# Patient Record
Sex: Male | Born: 1970 | Race: White | Hispanic: No | Marital: Married | Smoking: Never smoker
Health system: Southern US, Community
[De-identification: ages and names within clinical notes are randomized; demographics above are authoritative.]

## PROBLEM LIST (undated history)

## (undated) DIAGNOSIS — I10 Essential (primary) hypertension: Secondary | ICD-10-CM

## (undated) DIAGNOSIS — K219 Gastro-esophageal reflux disease without esophagitis: Secondary | ICD-10-CM

## (undated) DIAGNOSIS — G4730 Sleep apnea, unspecified: Secondary | ICD-10-CM

## (undated) DIAGNOSIS — K4090 Unilateral inguinal hernia, without obstruction or gangrene, not specified as recurrent: Secondary | ICD-10-CM

## (undated) HISTORY — PX: CHOLECYSTECTOMY: SHX55

## (undated) HISTORY — DX: Sleep apnea, unspecified: G47.30

## (undated) HISTORY — PX: ANKLE FRACTURE SURGERY: SHX122

## (undated) HISTORY — DX: Gastro-esophageal reflux disease without esophagitis: K21.9

---

## 2011-11-12 ENCOUNTER — Other Ambulatory Visit (HOSPITAL_COMMUNITY): Payer: Self-pay | Admitting: Internal Medicine

## 2011-11-12 DIAGNOSIS — R1310 Dysphagia, unspecified: Secondary | ICD-10-CM

## 2011-11-12 DIAGNOSIS — K219 Gastro-esophageal reflux disease without esophagitis: Secondary | ICD-10-CM

## 2011-11-14 ENCOUNTER — Other Ambulatory Visit (HOSPITAL_COMMUNITY): Payer: Self-pay | Admitting: Internal Medicine

## 2011-11-14 ENCOUNTER — Other Ambulatory Visit (HOSPITAL_COMMUNITY): Payer: Self-pay

## 2011-11-14 DIAGNOSIS — K219 Gastro-esophageal reflux disease without esophagitis: Secondary | ICD-10-CM

## 2011-11-14 DIAGNOSIS — R1310 Dysphagia, unspecified: Secondary | ICD-10-CM

## 2011-11-21 ENCOUNTER — Ambulatory Visit (HOSPITAL_COMMUNITY)
Admission: RE | Admit: 2011-11-21 | Discharge: 2011-11-21 | Disposition: A | Discharge status: Home / Self Care | Payer: PRIVATE HEALTH INSURANCE | Source: Ambulatory Visit | Attending: Internal Medicine | Admitting: Internal Medicine

## 2011-11-21 DIAGNOSIS — R1310 Dysphagia, unspecified: Secondary | ICD-10-CM

## 2011-11-21 DIAGNOSIS — R05 Cough: Secondary | ICD-10-CM | POA: Insufficient documentation

## 2011-11-21 DIAGNOSIS — R059 Cough, unspecified: Secondary | ICD-10-CM | POA: Insufficient documentation

## 2011-11-21 DIAGNOSIS — K219 Gastro-esophageal reflux disease without esophagitis: Secondary | ICD-10-CM

## 2012-05-09 ENCOUNTER — Emergency Department (HOSPITAL_COMMUNITY): Payer: PRIVATE HEALTH INSURANCE

## 2012-05-09 ENCOUNTER — Inpatient Hospital Stay (HOSPITAL_COMMUNITY)
Admission: EM | Admit: 2012-05-09 | Discharge: 2012-05-11 | DRG: 494 | Disposition: A | Discharge status: Home Health | Payer: PRIVATE HEALTH INSURANCE | Attending: Orthopedic Surgery | Admitting: Orthopedic Surgery

## 2012-05-09 ENCOUNTER — Encounter (HOSPITAL_COMMUNITY): Payer: Self-pay | Admitting: *Deleted

## 2012-05-09 DIAGNOSIS — W010XXA Fall on same level from slipping, tripping and stumbling without subsequent striking against object, initial encounter: Secondary | ICD-10-CM | POA: Diagnosis present

## 2012-05-09 DIAGNOSIS — S82843B Displaced bimalleolar fracture of unspecified lower leg, initial encounter for open fracture type I or II: Principal | ICD-10-CM | POA: Diagnosis present

## 2012-05-09 DIAGNOSIS — S82851A Displaced trimalleolar fracture of right lower leg, initial encounter for closed fracture: Secondary | ICD-10-CM

## 2012-05-09 LAB — BASIC METABOLIC PANEL
BUN: 11 mg/dL (ref 6–23)
Calcium: 9.4 mg/dL (ref 8.4–10.5)
Chloride: 103 mEq/L (ref 96–112)
Creatinine, Ser: 0.81 mg/dL (ref 0.50–1.35)
GFR calc Af Amer: >90 mL/min (ref >90)

## 2012-05-09 LAB — CBC WITH DIFFERENTIAL/PLATELET
Basophils Absolute: 0 10*3/uL (ref 0.0–0.1)
Basophils Relative: 0 % (ref 0–1)
Eosinophils Relative: 1 % (ref 0–5)
HCT: 40.4 % (ref 39.0–52.0)
Hemoglobin: 13.4 g/dL (ref 13.0–17.0)
MCH: 28.5 pg (ref 26.0–34.0)
MCHC: 33.2 g/dL (ref 30.0–36.0)
MCV: 86 fL (ref 78.0–100.0)
Monocytes Absolute: 1 10*3/uL (ref 0.1–1.0)
Monocytes Relative: 7 % (ref 3–12)
Neutro Abs: 11 10*3/uL — ABNORMAL HIGH (ref 1.7–7.7)
RDW: 13.3 % (ref 11.5–15.5)

## 2012-05-09 MED ORDER — FENTANYL CITRATE 0.05 MG/ML IJ SOLN
100.0000 ug | Freq: Once | INTRAMUSCULAR | Status: AC
  Administered 2012-05-09: 100 ug via INTRAMUSCULAR
  Filled 2012-05-09: qty 2

## 2012-05-09 MED ORDER — HYDROMORPHONE HCL PF 1 MG/ML IJ SOLN
INTRAMUSCULAR | Status: AC
  Administered 2012-05-09: 1 mg via INTRAVENOUS
  Filled 2012-05-09: qty 1

## 2012-05-09 MED ORDER — SODIUM CHLORIDE 0.9 % IV SOLN
INTRAVENOUS | Status: DC
  Administered 2012-05-09 – 2012-05-10 (×2): via INTRAVENOUS

## 2012-05-09 MED ORDER — HYDROMORPHONE HCL PF 1 MG/ML IJ SOLN
1.0000 mg | INTRAMUSCULAR | Status: DC | PRN
  Administered 2012-05-10 (×2): 1 mg via INTRAVENOUS
  Filled 2012-05-09 (×2): qty 1

## 2012-05-09 MED ORDER — ONDANSETRON HCL 4 MG/2ML IJ SOLN
4.0000 mg | INTRAMUSCULAR | Status: AC | PRN
  Administered 2012-05-09 – 2012-05-10 (×2): 4 mg via INTRAVENOUS
  Filled 2012-05-09 (×2): qty 2

## 2012-05-09 MED ORDER — ONDANSETRON HCL 4 MG/2ML IJ SOLN
INTRAMUSCULAR | Status: AC
  Administered 2012-05-09: 22:00:00
  Filled 2012-05-09: qty 2

## 2012-05-09 NOTE — ED Notes (Addendum)
[facility] transported Mr. Meidinger to [facility] CDU-5 for observation.  Dr. Rennis Chris is coming to evaluate the patient.   According to EMS, he began to get nauseas but mainly due to the ride in the back of the ambulance.

## 2012-05-09 NOTE — ED Provider Notes (Signed)
History     CSN: [id number]  Arrival date & time 05/09/12  1906   First MD Initiated Contact with Patient 05/09/12 1916      Chief Complaint  Patient presents with  . Fall  . Ankle Pain     HPI Pt was seen at 1845.   Per pt, c/o sudden onset and persistence of constant right ankle "pain" that began after he slipped/tripped down several steps PTA.  Has been unable to weight bear on his right ankle due to pain.  Denies knee pain, no hip pain, no head injury, no neck or back pain, no focal motor weakness, no tingling/numbness in extremities, no syncope, no CP/SOB, no abd pain.   History reviewed. No pertinent past medical history.  History reviewed. No pertinent past surgical history.   History  Substance Use Topics  . Smoking status: Never Smoker   . Smokeless tobacco: Not on file  . Alcohol Use: No      Review of Systems ROS: Statement: All systems negative except as marked or noted in the HPI; Constitutional: Negative for fever and chills. ; ; Eyes: Negative for eye pain, redness and discharge. ; ; ENMT: Negative for ear pain, hoarseness, nasal congestion, sinus pressure and sore throat. ; ; Cardiovascular: Negative for chest pain, palpitations, diaphoresis, dyspnea and peripheral edema. ; ; Respiratory: Negative for cough, wheezing and stridor. ; ; Gastrointestinal: Negative for nausea, vomiting, diarrhea, abdominal pain, blood in stool, hematemesis, jaundice and rectal bleeding. . ; ; Genitourinary: Negative for dysuria, flank pain and hematuria. ; ; Musculoskeletal: +right ankle pain. Negative for back pain and neck pain. Negative for swelling and trauma.; ; Skin: Negative for pruritus, rash, abrasions, blisters, bruising and skin lesion.; ; Neuro: Negative for headache, lightheadedness and neck stiffness. Negative for weakness, altered level of consciousness , altered mental status, extremity weakness, paresthesias, involuntary movement, seizure and syncope.      Allergies   Review of patient's allergies indicates no known allergies.  Home Medications   Current Outpatient Rx  Name  Route  Sig  Dispense  Refill  . NEXIUM PO   Oral   Take 1 capsule by mouth at bedtime.         Marland Kitchen HYDROCORTISONE-ALOE VERA 1 % EX CREA   Apply externally   Apply 1 application topically daily as needed. For irritation/rash           BP 158/93  Pulse 82  Temp 97.5 F (36.4 C) (Oral)  Resp 20  Ht 6\' 3"  (1.905 m)  Wt 320 lb (145.151 kg)  BMI 40.00 kg/m2  SpO2 98%  Physical Exam 1850: Physical examination:  Nursing notes reviewed; Vital signs and O2 SAT reviewed;  Constitutional: Well developed, Well nourished, Well hydrated, Uncomfortable appearing; Head:  Normocephalic, atraumatic; Eyes: EOMI, PERRL, No scleral icterus; ENMT: Mouth and pharynx normal, Mucous membranes moist; Neck: Supple, Full range of motion, No lymphadenopathy; Cardiovascular: Regular rate and rhythm, No murmur, rub, or gallop; Respiratory: Breath sounds clear & equal bilaterally, No rales, rhonchi, wheezes.  Speaking full sentences with ease, Normal respiratory effort/excursion; Chest: Nontender, Movement normal; Abdomen: Soft, Nontender, Nondistended, Normal bowel sounds;; Extremities: Pulses normal, +right ankle localized edema and generalized tenderness to palp. No open wounds, no skin tenting, no ecchymosis, no erythema. Strong pedal pulses bilat, brisk cap refill in toes. NT right hip/knee/foot  No calf edema or asymmetry.; Neuro: AA&Ox3, Major CN grossly intact.  Speech clear. No gross focal motor or sensory deficits in extremities.; Skin:  Color normal, Warm, Dry.   ED Course  Procedures     MDM  MDM Reviewed: nursing note and vitals Interpretation: x-ray   Dg Ankle 2 Views Right 05/09/2012  *RADIOLOGY REPORT*  Clinical Data: Fall.  Ankle pain.  RIGHT ANKLE - 2 VIEW  Comparison: None.  Findings: Right ankle fracture dislocation is present.  There is an oblique distal fibular fracture.   Posterior subluxation of the talus on the tibial plafond.  Posterior malleolar fracture.  Apex medial angulation of the ankle joint.  Medial malleolar fracture is present with the tip of the medial malleolus following the talus. The oblique distal fibular fracture is displaced 5 mm, with apex medial angulation.  IMPRESSION: Trimalleolar fracture dislocation of the right ankle.   Original Report Authenticated By: Andreas Newport, M.D.     Results for orders placed during the hospital encounter of 05/09/12  CBC WITH DIFFERENTIAL      Component Value Range   WBC 13.9 (*) 4.0 - 10.5 K/uL   RBC 4.70  4.22 - 5.81 MIL/uL   Hemoglobin 13.4  13.0 - 17.0 g/dL   HCT 40.9  81.1 - 91.4 %   MCV 86.0  78.0 - 100.0 fL   MCH 28.5  26.0 - 34.0 pg   MCHC 33.2  30.0 - 36.0 g/dL   RDW 78.2  95.6 - 21.3 %   Platelets 256  150 - 400 K/uL   Neutrophils Relative 79 (*) 43 - 77 %   Neutro Abs 11.0 (*) 1.7 - 7.7 K/uL   Lymphocytes Relative 13  12 - 46 %   Lymphs Abs 1.8  0.7 - 4.0 K/uL   Monocytes Relative 7  3 - 12 %   Monocytes Absolute 1.0  0.1 - 1.0 K/uL   Eosinophils Relative 1  0 - 5 %   Eosinophils Absolute 0.1  0.0 - 0.7 K/uL   Basophils Relative 0  0 - 1 %   Basophils Absolute 0.0  0.0 - 0.1 K/uL  BASIC METABOLIC PANEL      Component Value Range   Sodium 138  135 - 145 mEq/L   Potassium 3.8  3.5 - 5.1 mEq/L   Chloride 103  96 - 112 mEq/L   CO2 26  19 - 32 mEq/L   Glucose, Bld 114 (*) 70 - 99 mg/dL   BUN 11  6 - 23 mg/dL   Creatinine, Ser 0.86  0.50 - 1.35 mg/dL   Calcium 9.4  8.4 - 57.8 mg/dL   GFR calc non Af Amer >90  >90 mL/min   GFR calc Af Amer >90  >90 mL/min     2145:  Strong right pedal pulse continues, able to move toes.  Will place post splint with stirrup splint for support.  Dx and testing d/w pt and family.  Questions answered.  Verb understanding, agreeable to transfer/admit. T/C to Ortho Dr. Rennis Chris, case discussed, including:  HPI, pertinent PM/SHx, VS/PE, dx testing, ED course  and treatment:  Agrees with placing splint (make sure it is well padded), and is agreeable to accept transfer/admit, requests to send pt to [facility] ED and have them page him when pt gets there.  [facility] Charge RN called and aware.  2245:  Pt continues to c/o pain and nausea despite multiple doses of fentanyl, dilaudid and zofran IV.  States he often becomes nauseated when he takes pain meds. Splint has been applied, right pedal pulse continues strong, continues to be able to move toes, skin  W/D/good color. Will re-dose meds, EMS here for transport.     Laray Anger, DO 05/12/12 [phone]

## 2012-05-09 NOTE — ED Notes (Signed)
Larey Seat going down step and hurt  Right ankle

## 2012-05-10 ENCOUNTER — Inpatient Hospital Stay (HOSPITAL_COMMUNITY): Payer: PRIVATE HEALTH INSURANCE

## 2012-05-10 ENCOUNTER — Inpatient Hospital Stay (HOSPITAL_COMMUNITY): Payer: PRIVATE HEALTH INSURANCE | Admitting: Anesthesiology

## 2012-05-10 ENCOUNTER — Encounter (HOSPITAL_COMMUNITY): Payer: Self-pay | Admitting: Anesthesiology

## 2012-05-10 ENCOUNTER — Encounter (HOSPITAL_COMMUNITY)
Admission: EM | Disposition: A | Discharge status: Home Health | Payer: Self-pay | Source: Home / Self Care | Attending: Orthopedic Surgery

## 2012-05-10 HISTORY — PX: ORIF ANKLE FRACTURE: SHX5408

## 2012-05-10 LAB — URINALYSIS, ROUTINE W REFLEX MICROSCOPIC
Bilirubin Urine: NEGATIVE
Ketones, ur: NEGATIVE mg/dL
Nitrite: NEGATIVE
Specific Gravity, Urine: 1.018 (ref 1.005–1.030)
Urobilinogen, UA: 1 mg/dL (ref 0.0–1.0)

## 2012-05-10 SURGERY — OPEN REDUCTION INTERNAL FIXATION (ORIF) ANKLE FRACTURE
Anesthesia: General | Site: Ankle | Laterality: Right | Wound class: Clean

## 2012-05-10 MED ORDER — ACETAMINOPHEN 10 MG/ML IV SOLN
1000.0000 mg | Freq: Four times a day (QID) | INTRAVENOUS | Status: AC
  Administered 2012-05-10 – 2012-05-11 (×4): 1000 mg via INTRAVENOUS
  Filled 2012-05-10 (×3): qty 100

## 2012-05-10 MED ORDER — HYDROCHLOROTHIAZIDE 25 MG PO TABS
25.0000 mg | ORAL_TABLET | Freq: Every day | ORAL | Status: DC
  Administered 2012-05-10 – 2012-05-11 (×2): 25 mg via ORAL
  Filled 2012-05-10 (×2): qty 1

## 2012-05-10 MED ORDER — LACTATED RINGERS IV SOLN: INTRAVENOUS | Status: DC

## 2012-05-10 MED ORDER — PROPOFOL 10 MG/ML IV BOLUS
INTRAVENOUS | Status: DC | PRN
  Administered 2012-05-10: 20 mg via INTRAVENOUS
  Administered 2012-05-10: 300 mg via INTRAVENOUS

## 2012-05-10 MED ORDER — ONDANSETRON HCL 4 MG/2ML IJ SOLN: 4.0000 mg | Freq: Once | INTRAMUSCULAR | Status: DC | PRN

## 2012-05-10 MED ORDER — DEXTROSE 5 % IV SOLN
3.0000 g | INTRAVENOUS | Status: DC | PRN
  Administered 2012-05-10: 3 g via INTRAVENOUS

## 2012-05-10 MED ORDER — PROMETHAZINE HCL 25 MG RE SUPP: 25.0000 mg | Freq: Four times a day (QID) | RECTAL | Status: DC | PRN

## 2012-05-10 MED ORDER — BACITRACIN-NEOMYCIN-POLYMYXIN 400-5-5000 EX OINT
TOPICAL_OINTMENT | CUTANEOUS | Status: DC | PRN
  Administered 2012-05-10: 1 via TOPICAL

## 2012-05-10 MED ORDER — HYDROMORPHONE HCL PF 1 MG/ML IJ SOLN
0.2500 mg | INTRAMUSCULAR | Status: DC | PRN
  Administered 2012-05-09: 1 mg via INTRAVENOUS
  Administered 2012-05-10: 0.25 mg via INTRAVENOUS
  Administered 2012-05-10: 0.5 mg via INTRAVENOUS
  Administered 2012-05-10 (×2): 0.25 mg via INTRAVENOUS

## 2012-05-10 MED ORDER — METHOCARBAMOL 100 MG/ML IJ SOLN
500.0000 mg | Freq: Four times a day (QID) | INTRAVENOUS | Status: DC | PRN
  Filled 2012-05-10: qty 5

## 2012-05-10 MED ORDER — ROCURONIUM BROMIDE 100 MG/10ML IV SOLN
INTRAVENOUS | Status: DC | PRN
  Administered 2012-05-10: 40 mg via INTRAVENOUS

## 2012-05-10 MED ORDER — LACTATED RINGERS IV SOLN
INTRAVENOUS | Status: DC | PRN
  Administered 2012-05-10: 08:00:00 via INTRAVENOUS

## 2012-05-10 MED ORDER — SODIUM CHLORIDE 0.9 % IV SOLN
INTRAVENOUS | Status: DC | PRN
  Administered 2012-05-10: 07:00:00 via INTRAVENOUS

## 2012-05-10 MED ORDER — ONDANSETRON HCL 4 MG/2ML IJ SOLN: 4.0000 mg | Freq: Four times a day (QID) | INTRAMUSCULAR | Status: DC | PRN

## 2012-05-10 MED ORDER — MIDAZOLAM HCL 5 MG/5ML IJ SOLN
INTRAMUSCULAR | Status: DC | PRN
  Administered 2012-05-10: 1 mg via INTRAVENOUS

## 2012-05-10 MED ORDER — PANTOPRAZOLE SODIUM 40 MG IV SOLR
40.0000 mg | INTRAVENOUS | Status: DC
  Administered 2012-05-10: 40 mg via INTRAVENOUS
  Filled 2012-05-10 (×2): qty 40

## 2012-05-10 MED ORDER — DEXAMETHASONE SODIUM PHOSPHATE 10 MG/ML IJ SOLN
INTRAMUSCULAR | Status: DC | PRN
  Administered 2012-05-10: 10 mg via INTRAVENOUS

## 2012-05-10 MED ORDER — ONDANSETRON HCL 4 MG/2ML IJ SOLN
4.0000 mg | Freq: Four times a day (QID) | INTRAMUSCULAR | Status: DC | PRN
  Administered 2012-05-10 (×2): 4 mg via INTRAVENOUS
  Filled 2012-05-10 (×2): qty 2

## 2012-05-10 MED ORDER — ONDANSETRON HCL 4 MG/2ML IJ SOLN
INTRAMUSCULAR | Status: DC | PRN
  Administered 2012-05-10 (×2): 4 mg via INTRAVENOUS

## 2012-05-10 MED ORDER — METHOCARBAMOL 500 MG PO TABS
500.0000 mg | ORAL_TABLET | Freq: Four times a day (QID) | ORAL | Status: DC | PRN
  Administered 2012-05-10 – 2012-05-11 (×2): 500 mg via ORAL
  Filled 2012-05-10 (×2): qty 1

## 2012-05-10 MED ORDER — DOCUSATE SODIUM 100 MG PO CAPS
100.0000 mg | ORAL_CAPSULE | Freq: Two times a day (BID) | ORAL | Status: DC
Start: 2012-05-10 — End: 2012-05-11
  Administered 2012-05-10 – 2012-05-11 (×2): 100 mg via ORAL
  Filled 2012-05-10 (×2): qty 1

## 2012-05-10 MED ORDER — METOCLOPRAMIDE HCL 10 MG PO TABS
10.0000 mg | ORAL_TABLET | Freq: Three times a day (TID) | ORAL | Status: DC
  Administered 2012-05-10 – 2012-05-11 (×2): 10 mg via ORAL
  Filled 2012-05-10 (×4): qty 1

## 2012-05-10 MED ORDER — FLEET ENEMA 7-19 GM/118ML RE ENEM: 1.0000 | ENEMA | Freq: Once | RECTAL | Status: AC | PRN

## 2012-05-10 MED ORDER — ZOLPIDEM TARTRATE 5 MG PO TABS: 5.0000 mg | ORAL_TABLET | Freq: Every evening | ORAL | Status: DC | PRN

## 2012-05-10 MED ORDER — PROMETHAZINE HCL 25 MG/ML IJ SOLN: 12.5000 mg | Freq: Four times a day (QID) | INTRAMUSCULAR | Status: DC | PRN

## 2012-05-10 MED ORDER — ENOXAPARIN SODIUM 40 MG/0.4ML ~~LOC~~ SOLN
40.0000 mg | SUBCUTANEOUS | Status: DC
  Administered 2012-05-10: 40 mg via SUBCUTANEOUS
  Filled 2012-05-10 (×2): qty 0.4

## 2012-05-10 MED ORDER — POLYETHYLENE GLYCOL 3350 17 G PO PACK: 17.0000 g | PACK | Freq: Every day | ORAL | Status: DC | PRN

## 2012-05-10 MED ORDER — SUCCINYLCHOLINE CHLORIDE 20 MG/ML IJ SOLN
INTRAMUSCULAR | Status: DC | PRN
  Administered 2012-05-10: 140 mg via INTRAVENOUS

## 2012-05-10 MED ORDER — OXYCODONE HCL 5 MG PO TABS
5.0000 mg | ORAL_TABLET | ORAL | Status: DC | PRN
  Administered 2012-05-10 – 2012-05-11 (×2): 5 mg via ORAL
  Filled 2012-05-10 (×2): qty 1

## 2012-05-10 MED ORDER — ONDANSETRON HCL 4 MG PO TABS: 4.0000 mg | ORAL_TABLET | Freq: Four times a day (QID) | ORAL | Status: DC | PRN

## 2012-05-10 MED ORDER — ARTIFICIAL TEARS OP OINT
TOPICAL_OINTMENT | OPHTHALMIC | Status: DC | PRN
  Administered 2012-05-10: 1 via OPHTHALMIC

## 2012-05-10 MED ORDER — FENTANYL CITRATE 0.05 MG/ML IJ SOLN
INTRAMUSCULAR | Status: DC | PRN
  Administered 2012-05-10: 50 ug via INTRAVENOUS
  Administered 2012-05-10: 150 ug via INTRAVENOUS

## 2012-05-10 MED ORDER — SCOPOLAMINE 1 MG/3DAYS TD PT72
MEDICATED_PATCH | TRANSDERMAL | Status: DC | PRN
  Administered 2012-05-10: 1 via TRANSDERMAL

## 2012-05-10 MED ORDER — LIDOCAINE HCL (CARDIAC) 20 MG/ML IV SOLN
INTRAVENOUS | Status: DC | PRN
  Administered 2012-05-10: 100 mg via INTRAVENOUS

## 2012-05-10 MED ORDER — BISACODYL 5 MG PO TBEC: 5.0000 mg | DELAYED_RELEASE_TABLET | Freq: Every day | ORAL | Status: DC | PRN

## 2012-05-10 MED ORDER — MORPHINE SULFATE 2 MG/ML IJ SOLN: 1.0000 mg | INTRAMUSCULAR | Status: DC | PRN

## 2012-05-10 MED ORDER — PROMETHAZINE HCL 25 MG PO TABS: 25.0000 mg | ORAL_TABLET | Freq: Four times a day (QID) | ORAL | Status: DC | PRN

## 2012-05-10 MED ORDER — METOCLOPRAMIDE HCL 5 MG/ML IJ SOLN
5.0000 mg | Freq: Three times a day (TID) | INTRAMUSCULAR | Status: DC
  Filled 2012-05-10 (×2): qty 2

## 2012-05-10 MED ORDER — DIPHENHYDRAMINE HCL 12.5 MG/5ML PO ELIX: 12.5000 mg | ORAL_SOLUTION | ORAL | Status: DC | PRN

## 2012-05-10 SURGICAL SUPPLY — 77 items
BANDAGE ELASTIC 4 VELCRO ST LF (GAUZE/BANDAGES/DRESSINGS) ×2 IMPLANT
BANDAGE ELASTIC 6 VELCRO ST LF (GAUZE/BANDAGES/DRESSINGS) ×2 IMPLANT
BANDAGE ESMARK 6X9 LF (GAUZE/BANDAGES/DRESSINGS) ×1 IMPLANT
BIT DRILL TWIST 2.7 (BIT) ×2 IMPLANT
BLADE SURG 10 STRL SS (BLADE) ×2 IMPLANT
BLADE SURG ROTATE 9660 (MISCELLANEOUS) IMPLANT
BNDG ESMARK 6X9 LF (GAUZE/BANDAGES/DRESSINGS) ×2
CLOTH BEACON ORANGE TIMEOUT ST (SAFETY) ×2 IMPLANT
CLSR STERI-STRIP ANTIMIC 1/2X4 (GAUZE/BANDAGES/DRESSINGS) ×2 IMPLANT
COVER MAYO STAND STRL (DRAPES) IMPLANT
COVER SURGICAL LIGHT HANDLE (MISCELLANEOUS) ×4 IMPLANT
CUFF TOURNIQUET SINGLE 18IN (TOURNIQUET CUFF) ×2 IMPLANT
CUFF TOURNIQUET SINGLE 24IN (TOURNIQUET CUFF) IMPLANT
CUFF TOURNIQUET SINGLE 34IN LL (TOURNIQUET CUFF) IMPLANT
CUFF TOURNIQUET SINGLE 44IN (TOURNIQUET CUFF) IMPLANT
DRAPE C-ARM 42X72 X-RAY (DRAPES) ×2 IMPLANT
DRAPE INCISE IOBAN 66X45 STRL (DRAPES) ×2 IMPLANT
DRAPE LAPAROTOMY T 102X78X121 (DRAPES) IMPLANT
DRAPE OEC MINIVIEW 54X84 (DRAPES) IMPLANT
DRAPE PROXIMA HALF (DRAPES) IMPLANT
DRAPE SURG 17X23 STRL (DRAPES) ×2 IMPLANT
DRAPE U-SHAPE 47X51 STRL (DRAPES) ×2 IMPLANT
DRSG ADAPTIC 3X8 NADH LF (GAUZE/BANDAGES/DRESSINGS) ×2 IMPLANT
DRSG PAD ABDOMINAL 8X10 ST (GAUZE/BANDAGES/DRESSINGS) ×2 IMPLANT
DURAPREP 26ML APPLICATOR (WOUND CARE) ×2 IMPLANT
ELECT REM PT RETURN 9FT ADLT (ELECTROSURGICAL) ×2
ELECTRODE REM PT RTRN 9FT ADLT (ELECTROSURGICAL) ×1 IMPLANT
FACESHIELD LNG OPTICON STERILE (SAFETY) IMPLANT
GLOVE BIO SURGEON STRL SZ7.5 (GLOVE) ×2 IMPLANT
GLOVE BIO SURGEON STRL SZ8 (GLOVE) ×2 IMPLANT
GLOVE EUDERMIC 7 POWDERFREE (GLOVE) ×2 IMPLANT
GLOVE SS BIOGEL STRL SZ 7.5 (GLOVE) ×1 IMPLANT
GLOVE SUPERSENSE BIOGEL SZ 7.5 (GLOVE) ×1
GOWN STRL NON-REIN LRG LVL3 (GOWN DISPOSABLE) ×4 IMPLANT
GOWN STRL REIN XL XLG (GOWN DISPOSABLE) ×4 IMPLANT
KIT BASIN OR (CUSTOM PROCEDURE TRAY) ×2 IMPLANT
KIT ROOM TURNOVER OR (KITS) ×2 IMPLANT
MANIFOLD NEPTUNE II (INSTRUMENTS) IMPLANT
NEEDLE 22X1 1/2 (OR ONLY) (NEEDLE) IMPLANT
NS IRRIG 1000ML POUR BTL (IV SOLUTION) ×2 IMPLANT
PACK ORTHO EXTREMITY (CUSTOM PROCEDURE TRAY) ×2 IMPLANT
PAD ARMBOARD 7.5X6 YLW CONV (MISCELLANEOUS) ×4 IMPLANT
PAD CAST 4YDX4 CTTN HI CHSV (CAST SUPPLIES) ×2 IMPLANT
PADDING CAST ABS 6INX4YD NS (CAST SUPPLIES) ×1
PADDING CAST ABS COTTON 6X4 NS (CAST SUPPLIES) ×1 IMPLANT
PADDING CAST COTTON 4X4 STRL (CAST SUPPLIES) ×2
PLATE LCP 3.5 1/3 TUB 10HX117 (Plate) ×2 IMPLANT
SCREW CANN 24MM (Screw) IMPLANT
SCREW CANN S THRD/44 4.0 (Screw) ×2 IMPLANT
SCREW CORTEX 3.5 16MM (Screw) ×2 IMPLANT
SCREW CORTEX 3.5 18MM (Screw) ×1 IMPLANT
SCREW CORTEX 3.5 20MM (Screw) ×2 IMPLANT
SCREW CORTEX 3.5 24MM (Screw) ×1 IMPLANT
SCREW LOCK CORT ST 3.5X16 (Screw) ×2 IMPLANT
SCREW LOCK CORT ST 3.5X18 (Screw) ×1 IMPLANT
SCREW LOCK CORT ST 3.5X20 (Screw) ×2 IMPLANT
SCREW LOCK CORT ST 3.5X24 (Screw) ×1 IMPLANT
SCREW LOCK T15 FT 10X3.5X2.9X (Screw) ×1 IMPLANT
SCREW LOCKING 3.5X10 (Screw) ×1 IMPLANT
SCREW SHORT THREAD 4.0X46 (Screw) ×2 IMPLANT
SPLINT FIBERGLASS 4X15 (CAST SUPPLIES) ×2 IMPLANT
SPONGE GAUZE 4X4 12PLY (GAUZE/BANDAGES/DRESSINGS) ×2 IMPLANT
SPONGE LAP 4X18 X RAY DECT (DISPOSABLE) ×2 IMPLANT
STAPLER VISISTAT 35W (STAPLE) IMPLANT
STRIP CLOSURE SKIN 1/2X4 (GAUZE/BANDAGES/DRESSINGS) ×4 IMPLANT
SUCTION FRAZIER TIP 10 FR DISP (SUCTIONS) ×2 IMPLANT
SUT MNCRL AB 3-0 PS2 18 (SUTURE) IMPLANT
SUT VIC AB 0 CT1 27 (SUTURE)
SUT VIC AB 0 CT1 27XBRD ANBCTR (SUTURE) IMPLANT
SUT VIC AB 2-0 CT1 27 (SUTURE)
SUT VIC AB 2-0 CT1 TAPERPNT 27 (SUTURE) IMPLANT
SYR CONTROL 10ML LL (SYRINGE) IMPLANT
TOWEL OR 17X24 6PK STRL BLUE (TOWEL DISPOSABLE) ×2 IMPLANT
TOWEL OR 17X26 10 PK STRL BLUE (TOWEL DISPOSABLE) ×2 IMPLANT
TUBE CONNECTING 12X1/4 (SUCTIONS) ×2 IMPLANT
WASHER 7MM DIA (Washer) ×4 IMPLANT
WATER STERILE IRR 1000ML POUR (IV SOLUTION) IMPLANT

## 2012-05-10 NOTE — Op Note (Signed)
Edwin Rogers, Edwin Rogers        ACCOUNT NO.:  [id number]  MEDICAL RECORD NO.:  [id number]  LOCATION:  [facility]                         FACILITY:  [facility]  PHYSICIAN:  Vania Rea. Tawney Vanorman, M.D.  DATE OF BIRTH:  1971/02/23  DATE OF PROCEDURE: DATE OF DISCHARGE:                              OPERATIVE REPORT   ADDENDUM:  Ralene Bathe, PA-C was used as an Geophysicist/field seismologist throughout this case essential for help with positioning extremity and assistance to consider the patient's morbid obesity as well as retraction, manipulation of the fracture site, assistance with application of the hardware, wound closure, and intraoperative decision making.     Vania Rea. Cosimo Schertzer, M.D.     KMS/MEDQ  D:  05/10/2012  T:  05/10/2012  Job:  [id number]

## 2012-05-10 NOTE — ED Notes (Signed)
Pt vomiting.  Dr. Rennis Chris paged for medication order.

## 2012-05-10 NOTE — Anesthesia Postprocedure Evaluation (Signed)
  Anesthesia Post-op Note  Patient: Newman Waren  Procedure(s) Performed: Procedure(s) (LRB) with comments: OPEN REDUCTION INTERNAL FIXATION (ORIF) ANKLE FRACTURE (Right)  Patient Location: PACU  Anesthesia Type:General  Level of Consciousness: awake, alert  and patient cooperative  Airway and Oxygen Therapy: Patient Spontanous Breathing  Post-op Pain: mild  Post-op Assessment: Post-op Vital signs reviewed, Patient's Cardiovascular Status Stable, Respiratory Function Stable, Patent Airway, No signs of Nausea or vomiting and Pain level controlled  Post-op Vital Signs: stable  Complications: No apparent anesthesia complications

## 2012-05-10 NOTE — Op Note (Signed)
05/09/2012 - 05/10/2012  9:42 AM  PATIENT:   Edwin Rogers  41 y.o. male  PRE-OPERATIVE DIAGNOSIS:  fractured right ankle  POST-OPERATIVE DIAGNOSIS:  Right bimalleolar ankle fracture  PROCEDURE:  ORIF  SURGEON:  Medrith Veillon, Vania Rea M.D.  ASSISTANTS: Shuford pac   ANESTHESIA:   GET  EBL: min  SPECIMEN:  none  Drains: none  TT <27min   PATIENT DISPOSITION:  PACU - hemodynamically stable.    PLAN OF CARE: Admit to inpatient   Dictation#   [id number]

## 2012-05-10 NOTE — ED Notes (Signed)
Patient is resting comfortably. 

## 2012-05-10 NOTE — Transfer of Care (Signed)
Immediate Anesthesia Transfer of Care Note  Patient: Edwin Rogers  Procedure(s) Performed: Procedure(s) (LRB) with comments: OPEN REDUCTION INTERNAL FIXATION (ORIF) ANKLE FRACTURE (Right)  Patient Location: PACU  Anesthesia Type:General  Level of Consciousness: awake, alert  and oriented  Airway & Oxygen Therapy: Patient Spontanous Breathing and Patient connected to nasal cannula oxygen  Post-op Assessment: Report given to PACU RN, Post -op Vital signs reviewed and stable and Patient moving all extremities  Post vital signs: Reviewed and stable  Complications: No apparent anesthesia complications

## 2012-05-10 NOTE — Preoperative (Addendum)
Beta Blockers   Reason not to administer Beta Blockers:Not Applicable 

## 2012-05-10 NOTE — Op Note (Signed)
Edwin Rogers, Edwin Rogers        ACCOUNT NO.:  [id number]  MEDICAL RECORD NO.:  [id number]  LOCATION:  [facility]                         FACILITY:  [facility]  PHYSICIAN:  Vania Rea. Jaceion Aday, M.D.  DATE OF BIRTH:  27-Jan-1971  DATE OF PROCEDURE:  05/10/2012 DATE OF DISCHARGE:                              OPERATIVE REPORT   PREOPERATIVE DIAGNOSIS:  Displaced right bimalleolar ankle fracture dislocation.  POSTOPERATIVE DIAGNOSIS:  Displaced right bimalleolar ankle fracture dislocation.  PROCEDURE:  Open reduction and internal fixation of right bimalleolar ankle fracture.  SURGEON:  Vania Rea. Koleman Marling, MD  ASSISTANT:  Lucita Lora. Shuford, PA-C  ANESTHESIA:  General endotracheal.  TOURNIQUET TIME:  Less than 90 minutes.  ESTIMATED BLOOD LOSS:  Minimal.  DRAINS:  None.  HISTORY:  Mr. Sedor is a 41 year old gentleman, who tripped at home down several stairs, injuring his right ankle with immediate pain, deformity, and inability to bear weight.  He was taken to the [facility] where on evaluation he was found to have deformity of the right foot and ankle with x-rays obtained showing a right ankle fracture dislocation.  Orthopedic care was not available up in [place] and he was subsequently transferred to [facility] secondary to inability to mobilize and care for self.  I was consulted last night where on evaluation Mr. Narine noted to be a very large heavyset gentleman in obvious discomfort, complaining of nausea.  His right lower extremity had been splinted but he was grossly intact to light touch with good digital motion.  X-rays reviewed confirming fracture dislocation, although this had been apparently reduced when the splint was placed. He was subsequently admitted and brought to the operating room this morning for planned ORIF.  Preoperatively, I counseled Mr. Tippett and his family on treatment options as well as risks versus benefits thereof.  Possible  surgical complications were reviewed including potential bleeding, infection, neurovascular injury, DVT, PE, malunion, nonunion, loss of fixation, posttraumatic arthritis, anesthetic complication, possible need for additional surgery.  He understands and accepts and agrees with our planned procedure.  PROCEDURE IN DETAIL:  After undergoing routine preop evaluation, the patient did receive prophylactic antibiotics, brought into the operating room and placed supine on the operating table, underwent smooth induction of general endotracheal anesthesia.  Tourniquet applied to the right thigh.  Splint was removed.  Skin was carefully inspected and found to be intact with no obvious blistering.  The right lower extremity was then sterilely prepped and draped in standard fashion. Time-out was called.  The leg was exsanguinated with the tourniquet inflated to 350 mmHg.  We began with an anteromedial hockey stick incision about the malleolus with skin flaps elevated.  Dissection carried deep into the fracture site with care taken to protect the surrounding neurovascular structures.  Interposed soft tissue and periosteum was removed from the fracture site as was clot.  This was copiously irrigated.  Talar dome appeared to be intact.  Once we confirmed the reduction could be obtained, we then turned our attention laterally where a 10 cm longitudinal incision was made over the distal fibula.  Skin flaps elevated.  The peroneal muscles and tendons were reflected posteriorly and subperiosteal dissection was used to expose the fracture site and  the posterolateral cortex of the distal fibula. The fracture site was exposed.  The interposed soft tissue and clot was removed, irrigated and then under direct visualization, the fracture site was reduced.  I then contoured a 10 hole 1/3rd tubular locking plate to fit over the posterolateral cortex of the fibula.  Once this was applied with a bone holding  forceps, the overall construct was visualized radiographically showing good alignment.  We then repaired the fracture with 2 lag screws through the plate across the fracture site obtaining excellent fixation.  I then placed 2 distal locking screws into the tip of malleolus.  The balance of screws were filled with 3.5 cortical screws, the most proximal screw with a locking screw unicortical.  All screws had excellent bony purchase and fluoroscopic images showed good alignment and good position of the hardware.  We then once again turned our attention medially and under direct visualization the medial malleolus was reduced and the threaded guidewires for the 4.0 cannulated screws were then passed across the fracture site.  We confirmed proper position fluoroscopically.  This was filled with 46 mm screws with washers and again bony purchase was achieved with good fixation to our satisfaction.  The final fluoroscopic images were all showing good alignment, good position of the hardware.  All wounds were then copiously irrigated.  Closed with 2-0 Vicryl for the subcu layer and intracuticular 3-0 Monocryl for the skin, 0 Vicryl for the deep fascia, 2-0 Vicryl for subcu, and Steri-Strips for the skin laterally. Steri-Strips, Adaptic, and dry dressing placed over the incisions. Balance leg was coated with triple antibiotic ointment and a well-padded short-leg stirrup splint was applied with ankle in neutral position.  At this point, the tourniquet was then let down and the patient was awakened, extubated, and taken to the recovery room in stable condition.     Vania Rea. Itamar Mcgowan, M.D.     KMS/MEDQ  D:  05/10/2012  T:  05/10/2012  Job:  [id number]

## 2012-05-10 NOTE — Anesthesia Preprocedure Evaluation (Addendum)
Anesthesia Evaluation  Patient identified by MRN, date of birth, ID band Patient awake    Reviewed: Allergy & Precautions, NPO status   Airway Mallampati: IV TM Distance: >3 FB Neck ROM: Full    Dental  (+) Teeth Intact and Dental Advisory Given   Pulmonary          Cardiovascular Rhythm:regular Rate:Normal     Neuro/Psych    GI/Hepatic   Endo/Other    Renal/GU      Musculoskeletal   Abdominal   Peds  Hematology   Anesthesia Other Findings   Reproductive/Obstetrics                        Anesthesia Physical Anesthesia Plan  ASA: I  Anesthesia Plan: General   Post-op Pain Management:    Induction: Intravenous  Airway Management Planned: Oral ETT  Additional Equipment:   Intra-op Plan:   Post-operative Plan: Extubation in OR  Informed Consent: I have reviewed the patients History and Physical, chart, labs and discussed the procedure including the risks, benefits and alternatives for the proposed anesthesia with the patient or authorized representative who has indicated his/her understanding and acceptance.     Plan Discussed with: CRNA, Anesthesiologist and Surgeon  Anesthesia Plan Comments:         Anesthesia Quick Evaluation

## 2012-05-10 NOTE — Anesthesia Procedure Notes (Signed)
Procedure Name: Intubation Date/Time: 05/10/2012 7:53 AM Performed by: Julianne Rice K Pre-anesthesia Checklist: Emergency Drugs available, Patient identified, Timeout performed, Suction available and Patient being monitored Patient Re-evaluated:Patient Re-evaluated prior to inductionOxygen Delivery Method: Circle system utilized Preoxygenation: Pre-oxygenation with 100% oxygen Intubation Type: IV induction, Rapid sequence and Cricoid Pressure applied Ventilation: Mask ventilation without difficulty Laryngoscope Size: Mac and 4 Grade View: Grade III Tube type: Oral Tube size: 8.0 mm Number of attempts: 1 Airway Equipment and Method: Stylet and LTA kit utilized Placement Confirmation: positive ETCO2,  ETT inserted through vocal cords under direct vision and breath sounds checked- equal and bilateral Secured at: 25 cm Tube secured with: Tape Dental Injury: Teeth and Oropharynx as per pre-operative assessment

## 2012-05-10 NOTE — ED Notes (Signed)
Received call from [facility] in OR.  States pt to come to pre-op holding at 0630.  Anesthesia will be there to see pt.  Pt and his spouse informed of plan.

## 2012-05-10 NOTE — H&P (Signed)
Edwin Rogers    Chief Complaint: right ankle pain HPI: The patient is a 41 y.o. male fell at home this evening with immediate c/o right ankle pain and inability to bear weight. Seen in [facility] ED, splinted at transferred to [place].  PMH reflux  History reviewed. No pertinent past surgical history.  History reviewed. No pertinent family history.  Social History:  reports that he has never smoked. He does not have any smokeless tobacco history on file. He reports that he does not drink alcohol or use illicit drugs.  Allergies: No Known Allergies  Med: nexium   Physical Exam: heavy set white male, a and o, nauseated/vomited. RLE with short leg splint, moves toes well, intact to light touch  Xray with bimalleolar fracture dislocation  Vitals  Temp:  [97.5 F (36.4 C)-98.6 F (37 C)] 98.1 F (36.7 C) (12/20 2352) Pulse Rate:  [73-85] 73  (12/20 2352) Resp:  [18-20] 18  (12/20 2352) BP: (137-158)/(85-102) 137/102 mmHg (12/20 2352) SpO2:  [95 %-100 %] 95 % (12/20 2352) Weight:  [145.151 kg (320 lb)] 145.151 kg (320 lb) (12/20 1908)  Assessment/Plan  Impression: right ankle fracture dislocation  Plan of Action: admit due to patients inability to care for self and ambulate NWB. Treatment options, risks, benefits reviewed. Surgery in am  Martyna Thorns M 05/10/2012, 12:08 AM

## 2012-05-11 MED ORDER — METHOCARBAMOL 500 MG PO TABS: 500.0000 mg | ORAL_TABLET | Freq: Four times a day (QID) | ORAL | Status: DC | PRN

## 2012-05-11 MED ORDER — DSS 100 MG PO CAPS: 100.0000 mg | ORAL_CAPSULE | Freq: Two times a day (BID) | ORAL | Status: DC

## 2012-05-11 MED ORDER — ASCRIPTIN 325 MG PO TABS: 325.0000 mg | ORAL_TABLET | Freq: Two times a day (BID) | ORAL | Status: AC

## 2012-05-11 MED ORDER — HYDROCHLOROTHIAZIDE 25 MG PO TABS: 25.0000 mg | ORAL_TABLET | Freq: Every day | ORAL | Status: DC

## 2012-05-11 MED ORDER — PROMETHAZINE HCL 12.5 MG PO TABS: 12.5000 mg | ORAL_TABLET | Freq: Four times a day (QID) | ORAL | Status: DC | PRN

## 2012-05-11 MED ORDER — BISACODYL 5 MG PO TBEC: 5.0000 mg | DELAYED_RELEASE_TABLET | Freq: Every day | ORAL | Status: DC | PRN

## 2012-05-11 MED ORDER — OXYCODONE HCL 5 MG PO TABS
5.0000 mg | ORAL_TABLET | ORAL | Status: DC | PRN
Start: 2012-05-11 — End: 2013-04-13

## 2012-05-11 NOTE — Progress Notes (Signed)
Patient ID: Edwin Rogers, male   DOB: 09/30/1970, 41 y.o.   MRN: [id number] Subjective: 1 Day Post-Op Procedure(s) (LRB): OPEN REDUCTION INTERNAL FIXATION (ORIF) ANKLE FRACTURE (Right)    Patient reports pain as moderate but tolerable on pain meds  Objective:   VITALS:   Filed Vitals:   05/11/12 0930  BP: 144/82  Pulse:   Temp:   Resp:     Sensation intact distally Post op splint intact, dry  LABS  Basename 05/09/12 2055  HGB 13.4  HCT 40.4  WBC 13.9*  PLT 256     Basename 05/09/12 2055  NA 138  K 3.8  BUN 11  CREATININE 0.81  GLUCOSE 114*    No results found for this basename: LABPT:2,INR:2 in the last 72 hours   Assessment/Plan: 1 Day Post-Op Procedure(s) (LRB): OPEN REDUCTION INTERNAL FIXATION (ORIF) ANKLE FRACTURE (Right)   Discharge home with home health NWB RLE Keep splint dry Follow up 2 weeks

## 2012-05-11 NOTE — Progress Notes (Signed)
Occupational Therapy Evaluation Patient Details Name: Edwin Rogers MRN: [id number] DOB: 1970-06-21 Today's Date: 05/11/2012 Time: 1478-2956 OT Time Calculation (min): 20 min  OT Assessment / Plan / Recommendation Clinical Impression  41 yo s/p fall with R ankle fx. ORIF. NWB. Completed all education regarding ADL compensatory techniques and AE/DME needs, in addition to home safety. Pt will have adequate S for safe D/C home. no further OT needed.    OT Assessment  Patient does not need any further OT services    Follow Up Recommendations  No OT follow up    Barriers to Discharge  none    Equipment Recommendations  None recommended by OT    Recommendations for Other Services  none  Frequency    eval only   Precautions / Restrictions Precautions Precautions: None Restrictions Weight Bearing Restrictions: Yes RLE Weight Bearing: Non weight bearing   Pertinent Vitals/Pain 3. No request for pain meds    ADL  Grooming: Supervision/safety Where Assessed - Grooming: Unsupported standing Upper Body Bathing: Set up Where Assessed - Upper Body Bathing: Unsupported sitting Lower Body Bathing: Supervision/safety Where Assessed - Lower Body Bathing: Unsupported sit to stand Upper Body Dressing: Set up Where Assessed - Upper Body Dressing: Unsupported sitting Lower Body Dressing: Minimal assistance Where Assessed - Lower Body Dressing: Unsupported sit to stand Toilet Transfer: Supervision/safety Toilet Transfer Method: Sit to stand;Stand pivot Acupuncturist: Bedside commode Toileting - Clothing Manipulation and Hygiene: Supervision/safety Where Assessed - Engineer, mining and Hygiene: Standing Tub/Shower Transfer: Therapist, sports Method: Passenger transport manager: Other (comment) (3 in 1) Equipment Used: Gait belt;Rolling walker Transfers/Ambulation Related to ADLs: Mod I by end of session ADL Comments:  Completed education regarding compensatory techniques for ADL and available AE completed.discussed need for DME. Pt needs RW, but is going to borrow 3 in 1    OT Diagnosis:    OT Problem List:   OT Treatment Interventions:     OT Goals Acute Rehab OT Goals OT Goal Formulation:  (eval only)  Visit Information  Last OT Received On: 05/11/12 Assistance Needed: +1    Subjective Data      Prior Functioning     Home Living Lives With: Spouse Available Help at Discharge: Family;Available 24 hours/day Type of Home: House Home Access: Stairs to enter [employer] of Steps: 2 Entrance Stairs-Rails: None Home Layout: One level Bathroom Shower/Tub: Walk-in shower;Door [employer] Toilet: Pharmacist, community: Yes How Accessible: Accessible via walker Home Adaptive Equipment: None Prior Function Level of Independence: Independent Able to Take Stairs?: Yes Driving: Yes 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: No difficulties Dominant Hand: Right         Vision/Perception     Cognition  Overall Cognitive Status: Appears within functional limits for tasks assessed/performed Arousal/Alertness: Awake/alert Orientation Level: Appears intact for tasks assessed Behavior During Session: [facility] for tasks performed    Extremity/Trunk Assessment Right Upper Extremity Assessment RUE ROM/Strength/Tone: [facility] for tasks assessed Left Upper Extremity Assessment LUE ROM/Strength/Tone: [facility] for tasks assessed Right Lower Extremity Assessment RLE ROM/Strength/Tone: Deficits;Unable to fully assess;Due to pain;Due to precautions RLE ROM/Strength/Tone Deficits: hip and knee WFL. UTA ankle RLE Sensation: WFL - Light Touch Left Lower Extremity Assessment LLE ROM/Strength/Tone: WFL for tasks assessed LLE Sensation: WFL - Light Touch Trunk Assessment Trunk Assessment: Normal     Mobility Bed Mobility Bed Mobility: Not  assessed Supine to Sit: 6: Modified independent (Device/Increase time) Sitting - Scoot to Edge of Bed: 6:  Modified independent (Device/Increase time) Transfers Transfers: Sit to Stand;Stand to Sit Sit to Stand: 5: Supervision;With upper extremity assist;From chair/3-in-1 Stand to Sit: 5: Supervision;With upper extremity assist;To chair/3-in-1;To toilet Details for Transfer Assistance: s initially. Mod i at end of session     Shoulder Instructions     Exercise     Balance Balance Balance Assessed:  (WFl)   End of Session OT - End of Session Equipment Utilized During Treatment: Gait belt Activity Tolerance: Patient tolerated treatment well Patient left: in chair;with call bell/phone within reach Nurse Communication: Mobility status;Other (comment) (ready for d/C)  GO     Edwin Rogers,HILLARY 05/11/2012, 11:44 AM Luisa Dago, OTR/L  [phone] 05/11/2012

## 2012-05-11 NOTE — Evaluation (Signed)
Physical Therapy Evaluation Patient Details Name: Edwin Rogers MRN: [id number] DOB: [birth year] Today's Date: 05/11/2012 Time: 0810-0828 PT Time Calculation (min): 18 min  PT Assessment / Plan / Recommendation Clinical Impression  Pt s/p ORIF of R ankle fx. Pt moving well, still requiring assistance for safety. Pt may discharge home today, educated on safe technique at home. Pt will benefit from skilled PT in the acute care setting in order to maximize functional mobility and safety. '    PT Assessment  Patient needs continued PT services    Follow Up Recommendations  No PT follow up;Supervision for mobility/OOB    Does the patient have the potential to tolerate intense rehabilitation      Barriers to Discharge        Equipment Recommendations  Rolling walker with 5" wheels    Recommendations for Other Services     Frequency Min 4X/week    Precautions / Restrictions Precautions Precautions: None Restrictions Weight Bearing Restrictions: Yes RLE Weight Bearing: Non weight bearing   Pertinent Vitals/Pain Pain 4/10. Rn aware and pain meds given      Mobility  Bed Mobility Bed Mobility: Supine to Sit;Sitting - Scoot to Edge of Bed Supine to Sit: 6: Modified independent (Device/Increase time) Sitting - Scoot to Edge of Bed: 6: Modified independent (Device/Increase time) Transfers Transfers: Sit to Stand;Stand to Sit Sit to Stand: 4: Min guard;With upper extremity assist;From bed;From chair/3-in-1 Stand to Sit: 4: Min guard;With upper extremity assist;To chair/3-in-1 Details for Transfer Assistance: [place] for safety. Cues for safe hand placement and technique to maintain NWB during stand Ambulation/Gait Ambulation/Gait Assistance: 4: Min guard Ambulation Distance (Feet): 40 Feet Assistive device: Rolling walker Ambulation/Gait Assistance Details: VC for proper sequencing while hopping. Cues for safety Gait Pattern: Step-to pattern Gait velocity: slow   Stairs: Yes Stairs Assistance: 4: Min assist Stairs Assistance Details (indicate cue type and reason): VC for proper sequencing with backwards with RW. Min assist for stability Stair Management Technique: No rails;Step to pattern;Backwards;With walker Number of Stairs: 2     Shoulder Instructions     Exercises     PT Diagnosis: Acute pain  PT Problem List: Decreased activity tolerance;Decreased balance;Decreased mobility;Decreased knowledge of use of DME;Decreased safety awareness;Decreased knowledge of precautions PT Treatment Interventions: DME instruction;Gait training;Stair training;Functional mobility training;Therapeutic activities;Patient/family education   PT Goals Acute Rehab PT Goals PT Goal Formulation: With patient Time For Goal Achievement: 05/18/12 Potential to Achieve Goals: Good Pt will go Sit to Stand: with modified independence PT Goal: Sit to Stand - Progress: Goal set today Pt will go Stand to Sit: with modified independence PT Goal: Stand to Sit - Progress: Goal set today Pt will Transfer Bed to Chair/Chair to Bed: with modified independence PT Transfer Goal: Bed to Chair/Chair to Bed - Progress: Goal set today Pt will Ambulate: 16 - 50 feet;with supervision;with least restrictive assistive device PT Goal: Ambulate - Progress: Goal set today Pt will Go Up / Down Stairs: 1-2 stairs;with min assist;with rolling walker PT Goal: Up/Down Stairs - Progress: Goal set today  Visit Information  Last PT Received On: 05/11/12 Assistance Needed: +1    Subjective Data  Patient Stated Goal: to go home   Prior Functioning  Home Living Lives With: Spouse Available Help at Discharge: Family;Available 24 hours/day Type of Home: House Home Access: Stairs to enter [employer] of Steps: 2 Entrance Stairs-Rails: None Home Layout: One level Bathroom Shower/Tub: Walk-in shower;Door [employer] Toilet: Standard Bathroom Accessibility: Yes How Accessible:  Accessible via  walker Home Adaptive Equipment: None Prior Function Level of Independence: Independent Able to Take Stairs?: Yes Driving: Yes 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 for tasks performed    Extremity/Trunk Assessment Right Lower Extremity Assessment RLE ROM/Strength/Tone: Deficits;Unable to fully assess;Due to pain RLE ROM/Strength/Tone Deficits: hip and knee WFL. UTA ankle RLE Sensation: WFL - Light Touch Left Lower Extremity Assessment LLE ROM/Strength/Tone: Within functional levels LLE Sensation: WFL - Light Touch   Balance    End of Session PT - End of Session Equipment Utilized During Treatment: Gait belt Activity Tolerance: Patient tolerated treatment well Patient left: in chair;with call bell/phone within reach;Other (comment) (with OT) Nurse Communication: Mobility status  GP     Milana Kidney 05/11/2012, 9:39 AM  05/11/2012 Milana Kidney DPT PAGER: [phone] OFFICE: [phone]

## 2012-05-12 ENCOUNTER — Encounter (HOSPITAL_COMMUNITY): Payer: Self-pay | Admitting: Orthopedic Surgery

## 2012-05-15 NOTE — Discharge Summary (Signed)
Physician Discharge Summary  Patient ID: Edwin Rogers MRN: [id number] DOB/AGE: 11/22/1970 41 y.o.  Admit date: 05/09/2012 Discharge date: 05/15/2012  Admission Diagnoses: Right ankle fracture  Discharge Diagnoses: same  Discharged Condition: good  Hospital Course: Pt was admitted for operative care of his right ankle fracture and underwent ORIF on 05/10/12 and tolerated this well. All appropriate antibiotics were utilized. He was kept overnight for observationa nd pain control and on the following am was doing well. Pain was controlled and PT saw and evaluated patient. He was felt to be stable for DC home  Consults: None   Operative Procedure: Procedure(s): OPEN REDUCTION INTERNAL FIXATION (ORIF) ANKLE FRACTUREON12/20/2013 - 05/10/2012  Disposition: 06-Home-Health Care Svc     Medication List     As of 05/15/2012 11:07 AM    TAKE these medications         ASCRIPTIN 325 MG Tabs   Take 325 mg by mouth 2 (two) times daily.      bisacodyl 5 MG EC tablet   Commonly known as: DULCOLAX   Take 1 tablet (5 mg total) by mouth daily as needed.      CORTIZONE-10 INTENSIVE HEALING 1 % Crea   Generic drug: Hydrocortisone-Aloe Vera   Apply 1 application topically daily as needed. For irritation/rash      DSS 100 MG Caps   Take 100 mg by mouth 2 (two) times daily.      hydrochlorothiazide 25 MG tablet   Commonly known as: HYDRODIURIL   Take 1 tablet (25 mg total) by mouth daily.      methocarbamol 500 MG tablet   Commonly known as: ROBAXIN   Take 1 tablet (500 mg total) by mouth every 6 (six) hours as needed.      NEXIUM PO   Take 1 capsule by mouth at bedtime.      oxyCODONE 5 MG immediate release tablet   Commonly known as: Oxy IR/ROXICODONE   Take 1-2 tablets (5-10 mg total) by mouth every 3 (three) hours as needed.      promethazine 12.5 MG tablet   Commonly known as: PHENERGAN   Take 1-2 tablets (12.5-25 mg total) by mouth every 6 (six) hours as needed for  nausea.           Follow-up Information    Follow up with Senaida Lange, MD. In 2 weeks.   Contact information:   [address] [place] [address] [phone]          Discharge Instructions: NWB RLE Keep splint clean and dry and elevate right leg. Resume home meds and diet.  SignedRalene Bathe 05/15/2012, 11:07 AM

## 2013-03-06 ENCOUNTER — Encounter (INDEPENDENT_AMBULATORY_CARE_PROVIDER_SITE_OTHER): Payer: Self-pay | Admitting: *Deleted

## 2013-04-13 ENCOUNTER — Encounter (INDEPENDENT_AMBULATORY_CARE_PROVIDER_SITE_OTHER): Payer: Self-pay | Admitting: Internal Medicine

## 2013-04-13 ENCOUNTER — Telehealth (INDEPENDENT_AMBULATORY_CARE_PROVIDER_SITE_OTHER): Payer: Self-pay | Admitting: *Deleted

## 2013-04-13 ENCOUNTER — Ambulatory Visit (INDEPENDENT_AMBULATORY_CARE_PROVIDER_SITE_OTHER): Payer: PRIVATE HEALTH INSURANCE | Admitting: Internal Medicine

## 2013-04-13 ENCOUNTER — Other Ambulatory Visit (INDEPENDENT_AMBULATORY_CARE_PROVIDER_SITE_OTHER): Payer: Self-pay | Admitting: *Deleted

## 2013-04-13 VITALS — BP 112/82 | HR 72 | Temp 98.5°F | Ht 75.0 in | Wt 313.4 lb

## 2013-04-13 DIAGNOSIS — Z800 Family history of malignant neoplasm of digestive organs: Secondary | ICD-10-CM

## 2013-04-13 DIAGNOSIS — Z1211 Encounter for screening for malignant neoplasm of colon: Secondary | ICD-10-CM

## 2013-04-13 MED ORDER — PEG-KCL-NACL-NASULF-NA ASC-C 100 G PO SOLR: 1.0000 | Freq: Once | ORAL | Status: DC

## 2013-04-13 NOTE — Progress Notes (Signed)
Subjective:     Patient ID: Edwin Rogers, male   DOB: [birth year], 42 y.o.   MRN: [id number]  HPI Referred to our office by Dr. Regino Schultze for constipation/colonoscopy. He says he became constipated after dieting. He says it was so bad he had to disimpact himself. When he disimpacted himself, he saw blood. None since then. The constipation last for about a week. He started a stool softener and stools were much better. Stools are now softer. Appetite is good. He has lost weight which was intentional (25 pounds) No abdominal pain. Usually has a BM daily. Family hx of colon cancer father age 24 grandmother and grandfather, and uncle.   Review of Systems Current Outpatient Prescriptions  Medication Sig Dispense Refill  . bisacodyl (DULCOLAX) 5 MG EC tablet Take 1 tablet (5 mg total) by mouth daily as needed.  30 tablet  0  . docusate sodium 100 MG CAPS Take 100 mg by mouth 2 (two) times daily.  60 capsule  0  . Esomeprazole Magnesium (NEXIUM PO) Take 1 capsule by mouth at bedtime.       No current facility-administered medications for this visit.   Past Medical History  Diagnosis Date  . Sleep apnea   . GERD (gastroesophageal reflux disease)    Past Surgical History  Procedure Laterality Date  . Orif ankle fracture  05/10/2012    Procedure: OPEN REDUCTION INTERNAL FIXATION (ORIF) ANKLE FRACTURE;  Surgeon: Senaida Lange, MD;  Location: [facility] OR;  Service: Orthopedics;  Laterality: Right;  . Ankle fracture surgery      last year with a plate   No Known Allergies      Objective:   Physical Exam  Filed Vitals:   04/13/13 1103  BP: 112/82  Pulse: 72  Temp: 98.5 F (36.9 C)  Height: 6\' 3"  (1.905 m)  Weight: 313 lb 6.4 oz (142.157 kg)  Alert and oriented. Skin warm and dry. Oral mucosa is moist.   . Sclera anicteric, conjunctivae is pink. Thyroid not enlarged. No cervical lymphadenopathy. Lungs clear. Heart regular rate and rhythm.  Abdomen is soft. Bowel sounds are positive. No  hepatomegaly. No abdominal masses felt. No tenderness. Abdomen is obese. No edema to lower extremities.        Assessment:   Constipation resolved at this time. Using a stool softener. Probably related to dieting.   Family hx of colon cancer. Plan:    Screening colonoscopy. Family hx of colon cancer

## 2013-04-13 NOTE — Telephone Encounter (Signed)
Patient needs movi prep 

## 2013-04-13 NOTE — Patient Instructions (Signed)
Screening colonoscopy with Dr. Karilyn Cota.

## 2013-05-12 ENCOUNTER — Encounter (HOSPITAL_COMMUNITY): Payer: Self-pay | Admitting: Pharmacy Technician

## 2013-05-27 ENCOUNTER — Ambulatory Visit (HOSPITAL_COMMUNITY)
Admission: RE | Admit: 2013-05-27 | Discharge: 2013-05-27 | Disposition: A | Discharge status: Home / Self Care | Payer: PRIVATE HEALTH INSURANCE | Source: Ambulatory Visit | Attending: Internal Medicine | Admitting: Internal Medicine

## 2013-05-27 ENCOUNTER — Encounter (HOSPITAL_COMMUNITY): Payer: Self-pay | Admitting: *Deleted

## 2013-05-27 ENCOUNTER — Encounter (HOSPITAL_COMMUNITY)
Admission: RE | Disposition: A | Discharge status: Home / Self Care | Payer: Self-pay | Source: Ambulatory Visit | Attending: Internal Medicine

## 2013-05-27 DIAGNOSIS — Z800 Family history of malignant neoplasm of digestive organs: Secondary | ICD-10-CM

## 2013-05-27 DIAGNOSIS — K620 Anal polyp: Secondary | ICD-10-CM

## 2013-05-27 DIAGNOSIS — D126 Benign neoplasm of colon, unspecified: Secondary | ICD-10-CM | POA: Insufficient documentation

## 2013-05-27 DIAGNOSIS — K621 Rectal polyp: Secondary | ICD-10-CM

## 2013-05-27 DIAGNOSIS — Z1211 Encounter for screening for malignant neoplasm of colon: Secondary | ICD-10-CM

## 2013-05-27 HISTORY — PX: COLONOSCOPY: SHX5424

## 2013-05-27 SURGERY — COLONOSCOPY
Anesthesia: Moderate Sedation

## 2013-05-27 MED ORDER — MEPERIDINE HCL 50 MG/ML IJ SOLN
INTRAMUSCULAR | Status: DC | PRN
  Administered 2013-05-27 (×2): 25 mg via INTRAVENOUS

## 2013-05-27 MED ORDER — MIDAZOLAM HCL 5 MG/5ML IJ SOLN
INTRAMUSCULAR | Status: AC
  Filled 2013-05-27: qty 10

## 2013-05-27 MED ORDER — MIDAZOLAM HCL 5 MG/5ML IJ SOLN
INTRAMUSCULAR | Status: DC | PRN
  Administered 2013-05-27 (×5): 2 mg via INTRAVENOUS

## 2013-05-27 MED ORDER — MEPERIDINE HCL 50 MG/ML IJ SOLN
INTRAMUSCULAR | Status: AC
  Filled 2013-05-27: qty 1

## 2013-05-27 MED ORDER — SODIUM CHLORIDE 0.9 % IV SOLN
INTRAVENOUS | Status: DC
  Administered 2013-05-27: 12:00:00 via INTRAVENOUS

## 2013-05-27 MED ORDER — STERILE WATER FOR IRRIGATION IR SOLN
Status: DC | PRN
  Administered 2013-05-27: 13:00:00

## 2013-05-27 NOTE — Op Note (Signed)
COLONOSCOPY PROCEDURE REPORT  PATIENT:  Edwin Rogers  MR#:  [id number] Birthdate:  1970-07-11, 43 y.o., male Endoscopist:  Dr. Rogene Houston, MD Referred By:  Dr. Sherrilee Gilles. Gerarda Fraction, MD Procedure Date: 05/27/2013  Procedure:   Colonoscopy  Indications:  Patient is 43 year old Caucasian male who is undergoing screening colonoscopy. Family history is significant for colon carcinoma in first and second-degree relatives.  Informed Consent:  The procedure and risks were reviewed with the patient and informed consent was obtained.  Medications:  Demerol 50 mg IV Versed 10 mg IV  Description of procedure:  After a digital rectal exam was performed, that colonoscope was advanced from the anus through the rectum and colon to the area of the cecum, ileocecal valve and appendiceal orifice. The cecum was deeply intubated. These structures were well-seen and photographed for the record. From the level of the cecum and ileocecal valve, the scope was slowly and cautiously withdrawn. The mucosal surfaces were carefully surveyed utilizing scope tip to flexion to facilitate fold flattening as needed. The scope was pulled down into the rectum where a thorough exam including retroflexion was performed.  Findings:   Prep excellent. Three small polyps were removed and submitted together(polyp from the splenic flexure and sigmoid colon removed via cold biopsy and third polyp from distal sigmoid colon was cold snared). Four small polyps removed from rectum using cold snare and biopsy. These polyps were submitted together. Small hemorrhoids below the dentate line.   Therapeutic/Diagnostic Maneuvers Performed:  See above  Complications:  None  Cecal Withdrawal Time:  16  minutes  Impression:  Examination performed to cecum. Three small polyps removed using cold snared and biopsy. These polyps are submitted together(one at splenic flexure and two at sigmoid colon). Four small polyps from rectum were  submitted together; two were removed with biopsy forceps and two were cold snared.  Recommendations:  Standard instructions given. I will contact patient with biopsy results and further recommendations.  Noel Rodier U  05/27/2013 2:03 PM  CC: Dr. Glo Herring., MD & Dr. Rayne Du ref. provider found

## 2013-05-27 NOTE — H&P (Signed)
Edwin Rogers is an 42 y.o. male.   Chief Complaint: Patient is here for colonoscopy. HPI: Patient is 42-year-old Caucasian male who is here for high-risk screening colonoscopy. He has history of constipation but this has resolved. He had an episode of hematochezia 2 months ago felt to be secondary hemorrhoids. Family history significant for a colon carcinoma in his father who had right hemicolectomy at age 66 and diet he had later secondary to C. Difficile. Family history is also significant for colon carcinoma in maternal grandmother as well as great uncle.  Past Medical History  Diagnosis Date  . Sleep apnea   . GERD (gastroesophageal reflux disease)     Past Surgical History  Procedure Laterality Date  . Orif ankle fracture  05/10/2012    Procedure: OPEN REDUCTION INTERNAL FIXATION (ORIF) ANKLE FRACTURE;  Surgeon: Kevin M Supple, MD;  Location: [facility] OR;  Service: Orthopedics;  Laterality: Right;  . Ankle fracture surgery      last year with a plate    Family History  Problem Relation Age of Onset  . Colon cancer Father   . Colon cancer Other   . Colon cancer Other    Social History:  reports that he has never smoked. He does not have any smokeless tobacco history on file. He reports that he does not drink alcohol or use illicit drugs.  Allergies: No Known Allergies  Medications Prior to Admission  Medication Sig Dispense Refill  . Esomeprazole Magnesium (NEXIUM PO) Take 1 capsule by mouth at bedtime.      . famotidine-calcium carbonate-magnesium hydroxide (PEPCID COMPLETE) 10-800-165 MG CHEW chewable tablet Chew 1 tablet by mouth daily as needed. Heartburn      . peg 3350 powder (MOVIPREP) 100 G SOLR Take 1 kit (200 g total) by mouth once.  1 kit  0    No results found for this or any previous visit (from the past 48 hour(s)). No results found.  ROS  Blood pressure 144/84, pulse 67, temperature 98.2 F (36.8 C), temperature source Oral, resp. rate 16, height 6' 3"  (1.905 m), weight 313 lb (141.976 kg), SpO2 97.00%. Physical Exam  Constitutional: He appears well-developed and well-nourished.  HENT:  Mouth/Throat: Oropharynx is clear and moist.  Eyes: Conjunctivae are normal. No scleral icterus.  Neck: No thyromegaly present.  Cardiovascular: Normal rate, regular rhythm and normal heart sounds.   No murmur heard. Respiratory: Effort normal and breath sounds normal.  GI: Soft. He exhibits no distension and no mass. There is no tenderness.  Musculoskeletal: He exhibits no edema.  Lymphadenopathy:    He has no cervical adenopathy.  Neurological: He is alert.  Skin: Skin is warm and dry.     Assessment/Plan High-risk screening colonoscopy. Family history of colon carcinoma in multiple family numbers including father.   REHMAN,NAJEEB U 05/27/2013, 1:20 PM    

## 2013-05-27 NOTE — Discharge Instructions (Signed)
Resume usual medications and diet. No driving for 24 hours. Physician will contact you with biopsy results.  Colonoscopy Care After Read the instructions outlined below and refer to this sheet in the next few weeks. These discharge instructions provide you with general information on caring for yourself after you leave the hospital. Your doctor may also give you specific instructions. While your treatment has been planned according to the most current medical practices available, unavoidable complications occasionally occur. If you have any problems or questions after discharge, call your doctor. HOME CARE INSTRUCTIONS ACTIVITY:  You may resume your regular activity, but move at a slower pace for the next 24 hours.  Take frequent rest periods for the next 24 hours.  Walking will help get rid of the air and reduce the bloated feeling in your belly (abdomen).  No driving for 24 hours (because of the medicine (anesthesia) used during the test).  You may shower.  Do not sign any important legal documents or operate any machinery for 24 hours (because of the anesthesia used during the test). NUTRITION:  Drink plenty of fluids.  You may resume your normal diet as instructed by your doctor.  Begin with a light meal and progress to your normal diet. Heavy or fried foods are harder to digest and may make you feel sick to your stomach (nauseated).  Avoid alcoholic beverages for 24 hours or as instructed. MEDICATIONS:  You may resume your normal medications unless your doctor tells you otherwise. WHAT TO EXPECT TODAY:  Some feelings of bloating in the abdomen.  Passage of more gas than usual.  Spotting of blood in your stool or on the toilet paper. IF YOU HAD POLYPS REMOVED DURING THE COLONOSCOPY:  No aspirin products for 7 days or as instructed.  No alcohol for 7 days or as instructed.  Eat a soft diet for the next 24 hours. FINDING OUT THE RESULTS OF YOUR TEST Not all test  results are available during your visit. If your test results are not back during the visit, make an appointment with your caregiver to find out the results. Do not assume everything is normal if you have not heard from your caregiver or the medical facility. It is important for you to follow up on all of your test results.  SEEK IMMEDIATE MEDICAL CARE IF:  You have more than a spotting of blood in your stool.  Your belly is swollen (abdominal distention).  You are nauseated or vomiting.  You have a fever.  You have abdominal pain or discomfort that is severe or gets worse throughout the day. Document Released: 12/20/2003 Document Revised: 07/30/2011 Document Reviewed: 12/18/2007 [facility] Patient Information 2014 [place]. Colon Polyps Polyps are lumps of extra tissue growing inside the body. Polyps can grow in the large intestine (colon). Most colon polyps are noncancerous (benign). However, some colon polyps can become cancerous over time. Polyps that are larger than a pea may be harmful. To be safe, caregivers remove and test all polyps. CAUSES  Polyps form when mutations in the genes cause your cells to grow and divide even though no more tissue is needed. RISK FACTORS There are a number of risk factors that can increase your chances of getting colon polyps. They include:  Being older than 50 years.  Family history of colon polyps or colon cancer.  Long-term colon diseases, such as colitis or Crohn disease.  Being overweight.  Smoking.  Being inactive.  Drinking too much alcohol. SYMPTOMS  Most small polyps  do not cause symptoms. If symptoms are present, they may include:  Blood in the stool. The stool may look dark red or black.  Constipation or diarrhea that lasts longer than 1 week. DIAGNOSIS People often do not know they have polyps until their caregiver finds them during a regular checkup. Your caregiver can use 4 tests to check for polyps:  Digital rectal  exam. The caregiver wears gloves and feels inside the rectum. This test would find polyps only in the rectum.  Barium enema. The caregiver puts a liquid called barium into your rectum before taking X-rays of your colon. Barium makes your colon look white. Polyps are dark, so they are easy to see in the X-ray pictures.  Sigmoidoscopy. A thin, flexible tube (sigmoidoscope) is placed into your rectum. The sigmoidoscope has a light and tiny camera in it. The caregiver uses the sigmoidoscope to look at the last third of your colon.  Colonoscopy. This test is like sigmoidoscopy, but the caregiver looks at the entire colon. This is the most common method for finding and removing polyps. TREATMENT  Any polyps will be removed during a sigmoidoscopy or colonoscopy. The polyps are then tested for cancer. PREVENTION  To help lower your risk of getting more colon polyps:  Eat plenty of fruits and vegetables. Avoid eating fatty foods.  Do not smoke.  Avoid drinking alcohol.  Exercise every day.  Lose weight if recommended by your caregiver.  Eat plenty of calcium and folate. Foods that are rich in calcium include milk, cheese, and broccoli. Foods that are rich in folate include chickpeas, kidney beans, and spinach. HOME CARE INSTRUCTIONS Keep all follow-up appointments as directed by your caregiver. You may need periodic exams to check for polyps. SEEK MEDICAL CARE IF: You notice bleeding during a bowel movement. Document Released: 02/01/2004 Document Revised: 07/30/2011 Document Reviewed: 07/17/2011 [facility] Patient Information 2014 [place].

## 2013-06-01 ENCOUNTER — Encounter (HOSPITAL_COMMUNITY): Payer: Self-pay | Admitting: Internal Medicine

## 2013-06-10 ENCOUNTER — Encounter (INDEPENDENT_AMBULATORY_CARE_PROVIDER_SITE_OTHER): Payer: Self-pay | Admitting: *Deleted

## 2014-11-22 DIAGNOSIS — K219 Gastro-esophageal reflux disease without esophagitis: Secondary | ICD-10-CM | POA: Insufficient documentation

## 2014-11-22 DIAGNOSIS — I10 Essential (primary) hypertension: Secondary | ICD-10-CM | POA: Insufficient documentation

## 2014-11-22 DIAGNOSIS — E782 Mixed hyperlipidemia: Secondary | ICD-10-CM | POA: Insufficient documentation

## 2015-01-05 ENCOUNTER — Ambulatory Visit (HOSPITAL_COMMUNITY)
Admission: RE | Admit: 2015-01-05 | Discharge: 2015-01-05 | Disposition: A | Discharge status: Home / Self Care | Payer: PRIVATE HEALTH INSURANCE | Source: Ambulatory Visit | Attending: Internal Medicine | Admitting: Internal Medicine

## 2015-01-05 ENCOUNTER — Other Ambulatory Visit (HOSPITAL_COMMUNITY): Payer: Self-pay | Admitting: Internal Medicine

## 2015-01-05 DIAGNOSIS — M546 Pain in thoracic spine: Secondary | ICD-10-CM

## 2015-05-24 ENCOUNTER — Other Ambulatory Visit (HOSPITAL_COMMUNITY): Payer: Self-pay | Admitting: Internal Medicine

## 2015-05-24 DIAGNOSIS — R109 Unspecified abdominal pain: Secondary | ICD-10-CM

## 2015-05-27 ENCOUNTER — Encounter (HOSPITAL_COMMUNITY): Payer: Self-pay

## 2015-05-27 ENCOUNTER — Encounter (HOSPITAL_COMMUNITY)
Admission: RE | Admit: 2015-05-27 | Discharge: 2015-05-27 | Disposition: A | Discharge status: Home / Self Care | Payer: PRIVATE HEALTH INSURANCE | Source: Ambulatory Visit | Attending: Internal Medicine | Admitting: Internal Medicine

## 2015-05-27 DIAGNOSIS — R109 Unspecified abdominal pain: Secondary | ICD-10-CM | POA: Diagnosis not present

## 2015-05-27 DIAGNOSIS — N281 Cyst of kidney, acquired: Secondary | ICD-10-CM | POA: Diagnosis present

## 2015-05-27 MED ORDER — SINCALIDE 5 MCG IJ SOLR
INTRAMUSCULAR | Status: AC
  Administered 2015-05-27: 2.95 ug via INTRAVENOUS
  Filled 2015-05-27: qty 5

## 2015-05-27 MED ORDER — STERILE WATER FOR INJECTION IJ SOLN
INTRAMUSCULAR | Status: AC
  Administered 2015-05-27: 2.95 mL via INTRAVENOUS
  Filled 2015-05-27: qty 10

## 2015-05-27 MED ORDER — TECHNETIUM TC 99M MEBROFENIN IV KIT
5.0000 | PACK | Freq: Once | INTRAVENOUS | Status: AC | PRN
  Administered 2015-05-27: 5.3 via INTRAVENOUS

## 2015-06-02 ENCOUNTER — Encounter (INDEPENDENT_AMBULATORY_CARE_PROVIDER_SITE_OTHER): Payer: Self-pay | Admitting: *Deleted

## 2015-06-29 ENCOUNTER — Encounter (INDEPENDENT_AMBULATORY_CARE_PROVIDER_SITE_OTHER): Payer: Self-pay | Admitting: Internal Medicine

## 2015-06-29 ENCOUNTER — Ambulatory Visit (INDEPENDENT_AMBULATORY_CARE_PROVIDER_SITE_OTHER): Payer: PRIVATE HEALTH INSURANCE | Admitting: Internal Medicine

## 2015-06-29 VITALS — BP 144/100 | HR 80 | Temp 98.1°F | Ht 75.0 in | Wt 339.1 lb

## 2015-06-29 DIAGNOSIS — N2889 Other specified disorders of kidney and ureter: Secondary | ICD-10-CM | POA: Diagnosis not present

## 2015-06-29 DIAGNOSIS — R110 Nausea: Secondary | ICD-10-CM | POA: Diagnosis not present

## 2015-06-29 DIAGNOSIS — R1011 Right upper quadrant pain: Secondary | ICD-10-CM | POA: Diagnosis not present

## 2015-06-29 NOTE — Progress Notes (Signed)
Subjective:    Patient ID: Edwin Rogers, male    DOB: [birth year], 45 y.o.   MRN: [id number]  HPI Referred by Dr. Gerarda Fraction for pain in his rt upper quadrant and back x 1 year. About 3 months ago he had violent diarrhea and vomiting for a day. His wife became sick also. There was no fever associated with the symptoms. About 2 weeks later he had same symptoms. Since then he has had a small amt of diarrhea. He says his stool has changed. He says he stools are greasy like. His stools have been solid.  He still has some soreness on his rt upper abdomen. If he lies down and gets up he will have back pain. He also has some lower rt abdominal pain.  [place] foods, garlic bother him.  The other night he ate at the [employer] and he had nausea and rt upper quadrant pain. He has acid reflux daily if he doesn't use the C-pap and take the Pepcid.  05/27/2015 HIDA scan: rt upper quadrant pain with nausea and vomitig.  IMPRESSION: There is no evidence of cystic duct or common bile duct obstruction. The gallbladder ejection fraction is near the lower limit of normal at 45%.   04/26/2015 US abdomen:  Complex cyst left upper pole renal mass. Recommend renal protocol CT scan for further evaluation. Fatty infiltration of the liver. \ 05/27/2013 Colonoscopy  Indications: Patient is 45 year old Caucasian male who is undergoing screening colonoscopy. Family history is significant for colon carcinoma in first and second-degree relatives.  Impression:  Examination performed to cecum. Three small polyps removed using cold snared and biopsy. These polyps are submitted together(one at splenic flexure and two at sigmoid colon). Four small polyps from rectum were submitted together; two were removed with biopsy forceps and two were cold snared.  Small rectal polyps are hyperplastic. Off the 3 other small polyps one is sessile serrated adenoma and others hyperplastic. Patient called and message/resolved left on  his answering service. Next colonoscopy in 5 years Report to PCP  Review of Systems Past Medical History  Diagnosis Date  . Sleep apnea   . GERD (gastroesophageal reflux disease)     Past Surgical History  Procedure Laterality Date  . Orif ankle fracture  05/10/2012    Procedure: OPEN REDUCTION INTERNAL FIXATION (ORIF) ANKLE FRACTURE;  Surgeon: Marin Shutter, MD;  Location: [place];  Service: Orthopedics;  Laterality: Right;  . Ankle fracture surgery      last year with a plate  . Colonoscopy N/A 05/27/2013    Procedure: COLONOSCOPY;  Surgeon: Rogene Houston, MD;  [facility];  Service: Endoscopy;  Laterality: N/A;  100    No Known Allergies  Current Outpatient Prescriptions on File Prior to Visit  Medication Sig Dispense Refill  . famotidine-calcium carbonate-magnesium hydroxide (PEPCID COMPLETE) [phone] MG CHEW chewable tablet Chew 1 tablet by mouth daily. Heartburn     No current facility-administered medications on file prior to visit.        Objective:   Physical Exam Blood pressure 144/100, pulse 80, temperature 98.1 F (36.7 C), height 6\' 3"  (1.905 m), weight 339 lb 1.6 oz (153.815 kg).  Alert and oriented. Skin warm and dry. Oral mucosa is moist.   . Sclera anicteric, conjunctivae is pink. Thyroid not enlarged. No cervical lymphadenopathy. Lungs clear. Heart regular rate and rhythm.  Abdomen is soft. Bowel sounds are positive. No hepatomegaly. No abdominal masses felt. No tenderness.  No edema to lower  extremities.         Assessment & Plan:  Rt upper quadrant pain.  ? GB disease. Am going to try to rule out.  CT abdomen/pelvis with CM. Also patient has a renal mass.  Hepatic function. OV in  6 weeks.

## 2015-06-29 NOTE — Patient Instructions (Signed)
Hepatic function

## 2015-06-30 LAB — HEPATIC FUNCTION PANEL
ALBUMIN: 3.8 g/dL (ref 3.6–5.1)
ALT: 30 U/L (ref 9–46)
AST: 21 U/L (ref 10–40)
Alkaline Phosphatase: 98 U/L (ref 40–115)
BILIRUBIN TOTAL: 0.3 mg/dL (ref 0.2–1.2)
Bilirubin, Direct: 0.1 mg/dL (ref <=0.2)
Indirect Bilirubin: 0.2 mg/dL (ref 0.2–1.2)
TOTAL PROTEIN: 6.8 g/dL (ref 6.1–8.1)

## 2015-07-06 ENCOUNTER — Ambulatory Visit (HOSPITAL_COMMUNITY)
Admission: RE | Admit: 2015-07-06 | Discharge: 2015-07-06 | Disposition: A | Discharge status: Home / Self Care | Payer: PRIVATE HEALTH INSURANCE | Source: Ambulatory Visit | Attending: Internal Medicine | Admitting: Internal Medicine

## 2015-07-06 DIAGNOSIS — R911 Solitary pulmonary nodule: Secondary | ICD-10-CM | POA: Diagnosis not present

## 2015-07-06 DIAGNOSIS — D1779 Benign lipomatous neoplasm of other sites: Secondary | ICD-10-CM | POA: Insufficient documentation

## 2015-07-06 DIAGNOSIS — N2889 Other specified disorders of kidney and ureter: Secondary | ICD-10-CM | POA: Diagnosis not present

## 2015-07-06 DIAGNOSIS — R1011 Right upper quadrant pain: Secondary | ICD-10-CM

## 2015-07-06 DIAGNOSIS — R112 Nausea with vomiting, unspecified: Secondary | ICD-10-CM | POA: Insufficient documentation

## 2015-07-06 DIAGNOSIS — K4090 Unilateral inguinal hernia, without obstruction or gangrene, not specified as recurrent: Secondary | ICD-10-CM | POA: Insufficient documentation

## 2015-07-06 DIAGNOSIS — R110 Nausea: Secondary | ICD-10-CM

## 2015-07-06 MED ORDER — IOHEXOL 300 MG/ML  SOLN
100.0000 mL | Freq: Once | INTRAMUSCULAR | Status: AC | PRN
  Administered 2015-07-06: 100 mL via INTRAVENOUS

## 2015-07-07 ENCOUNTER — Encounter (INDEPENDENT_AMBULATORY_CARE_PROVIDER_SITE_OTHER): Payer: Self-pay

## 2015-07-19 ENCOUNTER — Other Ambulatory Visit: Payer: Self-pay | Admitting: Urology

## 2015-07-19 ENCOUNTER — Ambulatory Visit (INDEPENDENT_AMBULATORY_CARE_PROVIDER_SITE_OTHER): Payer: PRIVATE HEALTH INSURANCE | Admitting: Urology

## 2015-07-19 DIAGNOSIS — C642 Malignant neoplasm of left kidney, except renal pelvis: Secondary | ICD-10-CM

## 2015-07-19 DIAGNOSIS — D1772 Benign lipomatous neoplasm of other genitourinary organ: Secondary | ICD-10-CM

## 2015-07-27 ENCOUNTER — Other Ambulatory Visit (HOSPITAL_COMMUNITY): Payer: PRIVATE HEALTH INSURANCE

## 2015-08-01 ENCOUNTER — Ambulatory Visit (HOSPITAL_COMMUNITY)
Admission: RE | Admit: 2015-08-01 | Discharge: 2015-08-01 | Disposition: A | Discharge status: Home / Self Care | Payer: PRIVATE HEALTH INSURANCE | Source: Ambulatory Visit | Attending: Urology | Admitting: Urology

## 2015-08-01 DIAGNOSIS — C642 Malignant neoplasm of left kidney, except renal pelvis: Secondary | ICD-10-CM | POA: Insufficient documentation

## 2015-08-01 DIAGNOSIS — D1779 Benign lipomatous neoplasm of other sites: Secondary | ICD-10-CM | POA: Diagnosis not present

## 2015-08-01 DIAGNOSIS — K760 Fatty (change of) liver, not elsewhere classified: Secondary | ICD-10-CM | POA: Diagnosis not present

## 2015-08-01 MED ORDER — GADOBENATE DIMEGLUMINE 529 MG/ML IV SOLN
20.0000 mL | Freq: Once | INTRAVENOUS | Status: AC | PRN
  Administered 2015-08-01: 20 mL via INTRAVENOUS

## 2015-08-08 ENCOUNTER — Other Ambulatory Visit: Payer: Self-pay | Admitting: Surgery

## 2015-08-08 NOTE — H&P (Signed)
Edwin Rogers 08/08/2015 2:25 PM Location: [facility] Patient #: [id number] DOB: December 31, 1970 Married / Language: Cleophus Molt / Race: White Male  History of Present Illness [name] MD; 08/08/2015 3:15 PM) Patient words: gallbladder.  The patient is a 45 year old male who presents for evaluation of gallbladder disease. Note for "Gallbladder disease": Patient sent for surgical consultation by Dr. Franchot Gallo with [facility]. Abdominal pain. Probable biliary dyskinesia. Plan partial nephrectomy. Consider combination case  Pleasant active male. Comes today with his wife. Has had intermittent episodes of abdominal pain. Primarily RIGHT back and RIGHT upper quadrant. About 9 months ago. Usually mild. Seem to be after [place] food such as spaghetti and pizza. Then had a more intense episode of swelling and discomfort. Had a bad attack of nausea vomiting and diarrhea in November. Wife felt a little sick as well. Wondered if it was a stomach bug. That happened to 2 weeks later. Addition. Dr. Riley Kill ordered an ultrasound. No gallstones but kidney mass noted. Can an MRI converted a LEFT upper pole kidney mass at least 4 cm suspicious. Saw urology. Felt benefit from nephrectomy. Probable partial nephrectomy. Therefore referring to Dr. Phebe Colla for robotic partial resection.  In the meantime, he is still getting abdominal pains and attacks. That showed gallbladder ejection fraction was borderline normal. The production of symptoms. However patient is having trouble with attacks. He does have heartburn controlled with Pepcid at night. This does not seem like that. It is not improved by taking extra doses. He can walk a half hour without difficulty. Stays physically active. His a large overweight man but no exertional pain. Interestingly, his father had gallbladder problems or prior cholecystectomy and then developed kidney mass as well. His father was  diagnosed with colon cancer. The patient did have a colonoscopy in 2015. Many polyps removed. All but one were hyperplastic. He saw urology. They wondered if he would benefit from cholecystectomy at the time of the nephrectomy. Therefore sent for surgical consultation with [place] since we do robotic surgery.  No personal nor family history of GI/colon cancer, inflammatory bowel disease, irritable bowel syndrome, allergy such as Celiac Sprue, dietary/dairy problems, colitis, ulcers nor gastritis. No recent sick contacts/gastroenteritis. No travel outside the country. No changes in diet. No dysphagia to solids or liquids. No significant heartburn or reflux. No hematochezia, hematemesis, coffee ground emesis. No evidence of prior gastric/peptic ulceration.   Other Problems Marjean Donna, CMA; 08/08/2015 2:25 PM) Cancer Gastroesophageal Reflux Disease Sleep Apnea  Past Surgical History Marjean Donna, CMA; 08/08/2015 2:25 PM) Colon Polyp Removal - Colonoscopy Foot Surgery Right.  Diagnostic Studies History Marjean Donna, CMA; 08/08/2015 2:25 PM) Colonoscopy 1-5 years ago  Allergies Davy Pique Bynum, CMA; 08/08/2015 2:26 PM) No Known Drug Allergies 08/08/2015  Medication History (Sonya Bynum, CMA; 08/08/2015 2:26 PM) No Current Medications Medications Reconciled  Social History Marjean Donna, CMA; 08/08/2015 2:25 PM) Caffeine use Carbonated beverages, Coffee, Tea. No alcohol use No drug use Tobacco use Never smoker.  Family History Marjean Donna, [place]; 08/08/2015 2:25 PM) Cancer Father. Colon Cancer Father. Colon Polyps Father. Diabetes Mellitus Father. Hypertension Mother. Melanoma Father. Thyroid problems Brother.     Review of Systems Davy Pique Bynum CMA; 08/08/2015 2:25 PM) General Not Present- Appetite Loss, Chills, Fatigue, Fever, Night Sweats, Weight Gain and Weight Loss. Skin Present- Dryness. Not Present- Change in Wart/Mole, Hives, Jaundice, New Lesions,  Non-Healing Wounds, Rash and Ulcer. HEENT Not Present- Earache, Hearing Loss, Hoarseness, Nose Bleed, Oral Ulcers, Ringing in the Ears,  Seasonal Allergies, Sinus Pain, Sore Throat, Visual Disturbances, Wears glasses/contact lenses and Yellow Eyes. Respiratory Present- Snoring. Not Present- Bloody sputum, Chronic Cough, Difficulty Breathing and Wheezing. Breast Not Present- Breast Mass, Breast Pain, Nipple Discharge and Skin Changes. Cardiovascular Not Present- Chest Pain, Difficulty Breathing Lying Down, Leg Cramps, Palpitations, Rapid Heart Rate, Shortness of Breath and Swelling of Extremities. Gastrointestinal Present- Abdominal Pain, Bloating, Change in Bowel Habits, Hemorrhoids and Indigestion. Not Present- Bloody Stool, Chronic diarrhea, Constipation, Difficulty Swallowing, Excessive gas, Gets full quickly at meals, Nausea, Rectal Pain and Vomiting. Male Genitourinary Not Present- Blood in Urine, Change in Urinary Stream, Frequency, Impotence, Nocturia, Painful Urination, Urgency and Urine Leakage. Musculoskeletal Present- Joint Stiffness. Not Present- Back Pain, Joint Pain, Muscle Pain, Muscle Weakness and Swelling of Extremities. Neurological Not Present- Decreased Memory, Fainting, Headaches, Numbness, Seizures, Tingling, Tremor, Trouble walking and Weakness. Psychiatric Not Present- Anxiety, Bipolar, Change in Sleep Pattern, Depression, Fearful and Frequent crying. Endocrine Not Present- Cold Intolerance, Excessive Hunger, Hair Changes, Heat Intolerance, Hot flashes and New Diabetes. Hematology Not Present- Easy Bruising, Excessive bleeding, Gland problems, HIV and Persistent Infections.  Vitals (Sonya Bynum CMA; 08/08/2015 2:25 PM) 08/08/2015 2:25 PM Weight: 339 lb Height: 75in Body Surface Area: 2.75 m Body Mass Index: 42.37 kg/m  Temp.: 82F(Temporal)  Pulse: 80 (Regular)  BP: 132/80 (Sitting, Left Arm, Standard)      Physical Exam [name] MD; 08/08/2015 3:08  PM)  General Mental Status-Alert. General Appearance-Not in acute distress, Not Sickly. Orientation-Oriented X3. Hydration-Well hydrated. Voice-Normal.  Integumentary Global Assessment Upon inspection and palpation of skin surfaces of the - Axillae: non-tender, no inflammation or ulceration, no drainage. and Distribution of scalp and body hair is normal. General Characteristics Temperature - normal warmth is noted.  Head and Neck Head-normocephalic, atraumatic with no lesions or palpable masses. Face Global Assessment - atraumatic, no absence of expression. Neck Global Assessment - no abnormal movements, no bruit auscultated on the right, no bruit auscultated on the left, no decreased range of motion, non-tender. Trachea-midline. Thyroid Gland Characteristics - non-tender.  Eye Eyeball - Left-Extraocular movements intact, No Nystagmus. Eyeball - Right-Extraocular movements intact, No Nystagmus. Cornea - Left-No Hazy. Cornea - Right-No Hazy. Sclera/Conjunctiva - Left-No scleral icterus, No Discharge. Sclera/Conjunctiva - Right-No scleral icterus, No Discharge. Pupil - Left-Direct reaction to light normal. Pupil - Right-Direct reaction to light normal.  ENMT Ears Pinna - Left - no drainage observed, no generalized tenderness observed. Right - no drainage observed, no generalized tenderness observed. Nose and Sinuses External Inspection of the Nose - no destructive lesion observed. Inspection of the nares - Left - quiet respiration. Right - quiet respiration. Mouth and Throat Lips - Upper Lip - no fissures observed, no pallor noted. Lower Lip - no fissures observed, no pallor noted. Nasopharynx - no discharge present. Oral Cavity/Oropharynx - Tongue - no dryness observed. Oral Mucosa - no cyanosis observed. Hypopharynx - no evidence of airway distress observed.  Chest and Lung Exam Inspection Movements - Normal and Symmetrical. Accessory muscles  - No use of accessory muscles in breathing. Palpation Palpation of the chest reveals - Non-tender. Auscultation Breath sounds - Normal and Clear.  Cardiovascular Auscultation Rhythm - Regular. Murmurs & Other Heart Sounds - Auscultation of the heart reveals - No Murmurs and No Systolic Clicks.  Abdomen Inspection Inspection of the abdomen reveals - No Visible peristalsis and No Abnormal pulsations. Umbilicus - No Bleeding, No Urine drainage. Palpation/Percussion Palpation and Percussion of the abdomen reveal - Soft, Non Tender, No  Rebound tenderness, No Rigidity (guarding) and No Cutaneous hyperesthesia. Note: Obese but soft. Mild diastases recti. No umbilical hernia.  RIGHT upper quadrant discomfort but no true Murphy sign. Rest the abdomen nontender nondistended.  Male Genitourinary Sexual Maturity Tanner 5 - Adult hair pattern and Adult penile size and shape. Note: Normal external male genitalia. Subtle impulse in the LEFT groin suspicious for small inguinal hernia on Valsalva only. No RIGHT inguinal hernia.  Peripheral Vascular Upper Extremity Inspection - Left - No Cyanotic nailbeds, Not Ischemic. Right - No Cyanotic nailbeds, Not Ischemic.  Neurologic Neurologic evaluation reveals -normal attention span and ability to concentrate, able to name objects and repeat phrases. Appropriate fund of knowledge , normal sensation and normal coordination. Mental Status Affect - not angry, not paranoid. Cranial Nerves-Normal Bilaterally. Gait-Normal.  Neuropsychiatric Mental status exam performed with findings of-able to articulate well with normal speech/language, rate, volume and coherence, thought content normal with ability to perform basic computations and apply abstract reasoning and no evidence of hallucinations, delusions, obsessions or homicidal/suicidal ideation.  Musculoskeletal Global Assessment Spine, Ribs and Pelvis - no instability, subluxation or laxity.  Right Upper Extremity - no instability, subluxation or laxity.  Lymphatic Head & Neck  General Head & Neck Lymphatics: Bilateral - Description - No Localized lymphadenopathy. Axillary  General Axillary Region: Bilateral - Description - No Localized lymphadenopathy. Femoral & Inguinal  Generalized Femoral & Inguinal Lymphatics: Left - Description - No Localized lymphadenopathy. Right - Description - No Localized lymphadenopathy.    Assessment & Plan [name] MD; 08/08/2015 3:11 PM)  BILIARY DYSKINESIA (K82.8) Impression: Classic story biliary dyskinesia but objective evidence shows borderline gallbladder ejection fraction. [place] symptoms. To the differential diagnosis seems unlikely. I think he would benefit from cholecystectomy.  It is reasonable to plan to do in combination with his LEFT partial/total nephrectomy. I believe they are going to see Dr. Phebe Colla with [facility] to discuss options. I'm happy to coordinate the case with Clare Gandy or whomever is doing for their group. They seem interested in proceeding.  CHRONIC CHOLECYSTITIS WITHOUT CALCULUS (K81.1)  Current Plans You are being scheduled for surgery - Our schedulers will call you.  You should hear from our office's scheduling department within 5 working days about the location, date, and time of surgery. We try to make accommodations for patient's preferences in scheduling surgery, but sometimes the OR schedule or the surgeon's schedule prevents [place] from making those accommodations.  If you have not heard from our office [phone]) in 5 working days, call the office and ask for your surgeon's nurse.  If you have other questions about your diagnosis, plan, or surgery, call the office and ask for your surgeon's nurse.  The anatomy & physiology of hepatobiliary & pancreatic function was discussed. The pathophysiology of gallbladder dysfunction was discussed. Natural history risks without surgery was discussed. I  feel the risks of no intervention will lead to serious problems that outweigh the operative risks; therefore, I recommended cholecystectomy to remove the pathology. I explained laparoscopic techniques with possible need for an open approach. Probable cholangiogram to evaluate the bilary tract was explained as well.  Risks such as bleeding, infection, abscess, leak, injury to other organs, need for further treatment, heart attack, death, and other risks were discussed. I noted a good likelihood this will help address the problem. Possibility that this will not correct all abdominal symptoms was explained. Goals of post-operative recovery were discussed as well. We will work to minimize complications. An educational handout  further explaining the pathology and treatment options was given as well. Questions were answered. The patient expresses understanding & wishes to proceed with surgery.  Pt Education - Pamphlet Given - Laparoscopic Gallbladder Surgery: discussed with patient and provided information. Written instructions provided Pt Education - [facility] Laparosopic Post Op HCI (Taniesha Glanz) Pt Education - [facility] Good Bowel Health (Valery Chance) Pt Education - Laparoscopic Cholecystectomy: gallbladder LEFT INGUINAL HERNIA (K40.90) Impression: Possible subtle LEFT inguinal hernia on CT scan and physical exam. Asymptomatic. Certainly not the highest priority right now. Would not repair at the same time of the other surgeries as mesh repair would be best. Follow for now. Consider repair in the future.  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 That would be 2020. Defer to his gastroenterologist. All the other polyps were hyperplastic.  [name], M.D., F.A.C.S. Gastrointestinal and Minimally Invasive Surgery [facility] [address], [place], [place] [address] [phone] Main / Paging

## 2015-08-10 ENCOUNTER — Ambulatory Visit (INDEPENDENT_AMBULATORY_CARE_PROVIDER_SITE_OTHER): Payer: PRIVATE HEALTH INSURANCE | Admitting: Internal Medicine

## 2015-08-11 ENCOUNTER — Encounter (INDEPENDENT_AMBULATORY_CARE_PROVIDER_SITE_OTHER): Payer: Self-pay | Admitting: Internal Medicine

## 2015-08-25 ENCOUNTER — Other Ambulatory Visit: Payer: Self-pay | Admitting: Urology

## 2015-09-19 ENCOUNTER — Encounter (HOSPITAL_COMMUNITY): Payer: Self-pay

## 2015-09-19 ENCOUNTER — Encounter (HOSPITAL_COMMUNITY)
Admission: RE | Admit: 2015-09-19 | Discharge: 2015-09-19 | Disposition: A | Discharge status: Home / Self Care | Payer: PRIVATE HEALTH INSURANCE | Source: Ambulatory Visit | Attending: Urology | Admitting: Urology

## 2015-09-19 HISTORY — DX: Essential (primary) hypertension: I10

## 2015-09-19 HISTORY — DX: Unilateral inguinal hernia, without obstruction or gangrene, not specified as recurrent: K40.90

## 2015-09-19 LAB — ABO/RH: ABO/RH(D): O POS

## 2015-09-19 LAB — CBC
HCT: 37.4 % — ABNORMAL LOW (ref 39.0–52.0)
Hemoglobin: 12.2 g/dL — ABNORMAL LOW (ref 13.0–17.0)
MCH: 27.5 pg (ref 26.0–34.0)
MCHC: 32.6 g/dL (ref 30.0–36.0)
MCV: 84.2 fL (ref 78.0–100.0)
PLATELETS: 234 10*3/uL (ref 150–400)
RBC: 4.44 MIL/uL (ref 4.22–5.81)
RDW: 13.5 % (ref 11.5–15.5)
WBC: 7 10*3/uL (ref 4.0–10.5)

## 2015-09-19 LAB — BASIC METABOLIC PANEL
Anion gap: 9 (ref 5–15)
BUN: 13 mg/dL (ref 6–20)
CALCIUM: 9.2 mg/dL (ref 8.9–10.3)
CO2: 25 mmol/L (ref 22–32)
CREATININE: 0.97 mg/dL (ref 0.61–1.24)
Chloride: 107 mmol/L (ref 101–111)
GFR calc Af Amer: >60 mL/min (ref >60)
GFR calc non Af Amer: >60 mL/min (ref >60)
GLUCOSE: 127 mg/dL — AB (ref 65–99)
Potassium: 4.1 mmol/L (ref 3.5–5.1)
Sodium: 141 mmol/L (ref 135–145)

## 2015-09-19 NOTE — Patient Instructions (Signed)
Edwin Rogers  09/19/2015   Your procedure is scheduled on: Thursday Sep 22, 2015  Report to [facility] Main  Entrance take [place]  elevators to 3rd floor to  [place] at 5:15 AM.  Call this number if you have problems the morning of surgery [phone]   Remember: ONLY 1 PERSON MAY GO WITH YOU TO SHORT STAY TO GET  READY MORNING OF [place].  Do not eat food or drink liquids :After Midnight.     Take these medicines the morning of surgery with A SIP OF WATER: NONE                               You may not have any metal on your body including hair pins and              piercings  Do not wear jewelry, lotions, powders or colognes, deodorant                        Men may shave face and neck.   Do not bring valuables to the hospital. [place].  Contacts, dentures or bridgework may not be worn into surgery.  Leave suitcase in the car. After surgery it may be brought to your room.      Special Instructions: BRING CPAP MASK AND TUBING DAY OF SURGERY; FOLLOW SURGEON'S INSTRUCTION IN REGARDS TO BOWEL PREPARATION PRIOR TO SURGERY DATE               _____________________________________________________________________             [facility] - Preparing for Surgery Before surgery, you can play an important role.  Because skin is not sterile, your skin needs to be as free of germs as possible.  You can reduce the number of germs on your skin by washing with CHG (chlorahexidine gluconate) soap before surgery.  CHG is an antiseptic cleaner which kills germs and bonds with the skin to continue killing germs even after washing. Please DO NOT use if you have an allergy to CHG or antibacterial soaps.  If your skin becomes reddened/irritated stop using the CHG and inform your nurse when you arrive at Short Stay. Do not shave (including legs and underarms) for at least 48 hours prior to the first CHG shower.   You may shave your face/neck. Please follow these instructions carefully:  1.  Shower with CHG Soap the night before surgery and the  morning of Surgery.  2.  If you choose to wash your hair, wash your hair first as usual with your  normal  shampoo.  3.  After you shampoo, rinse your hair and body thoroughly to remove the  shampoo.                           4.  Use CHG as you would any other liquid soap.  You can apply chg directly  to the skin and wash                       Gently with a scrungie or clean washcloth.  5.  Apply the CHG Soap to your body  ONLY FROM THE NECK DOWN.   Do not use on face/ open                           Wound or open sores. Avoid contact with eyes, ears mouth and genitals (private parts).                       Wash face,  Genitals (private parts) with your normal soap.             6.  Wash thoroughly, paying special attention to the area where your surgery  will be performed.  7.  Thoroughly rinse your body with warm water from the neck down.  8.  DO NOT shower/wash with your normal soap after using and rinsing off  the CHG Soap.                9.  Pat yourself dry with a clean towel.            10.  Wear clean pajamas.            11.  Place clean sheets on your bed the night of your first shower and do not  sleep with pets. Day of Surgery : Do not apply any lotions/deodorants the morning of surgery.  Please wear clean clothes to the hospital/surgery center.  FAILURE TO FOLLOW THESE INSTRUCTIONS MAY RESULT IN THE CANCELLATION OF YOUR SURGERY PATIENT SIGNATURE_________________________________  NURSE SIGNATURE__________________________________  ________________________________________________________________________

## 2015-09-22 ENCOUNTER — Encounter (HOSPITAL_COMMUNITY)
Admission: RE | Disposition: A | Discharge status: Home / Self Care | Payer: Self-pay | Source: Ambulatory Visit | Attending: Urology

## 2015-09-22 ENCOUNTER — Inpatient Hospital Stay (HOSPITAL_COMMUNITY): Payer: PRIVATE HEALTH INSURANCE | Admitting: Certified Registered"

## 2015-09-22 ENCOUNTER — Inpatient Hospital Stay (HOSPITAL_COMMUNITY)
Admission: RE | Admit: 2015-09-22 | Discharge: 2015-09-23 | DRG: 657 | Disposition: A | Discharge status: Home / Self Care | Payer: PRIVATE HEALTH INSURANCE | Source: Ambulatory Visit | Attending: Urology | Admitting: Urology

## 2015-09-22 ENCOUNTER — Encounter (HOSPITAL_COMMUNITY): Payer: Self-pay | Admitting: *Deleted

## 2015-09-22 DIAGNOSIS — Z8371 Family history of colonic polyps: Secondary | ICD-10-CM | POA: Diagnosis not present

## 2015-09-22 DIAGNOSIS — K219 Gastro-esophageal reflux disease without esophagitis: Secondary | ICD-10-CM | POA: Diagnosis present

## 2015-09-22 DIAGNOSIS — N2889 Other specified disorders of kidney and ureter: Secondary | ICD-10-CM | POA: Diagnosis present

## 2015-09-22 DIAGNOSIS — C642 Malignant neoplasm of left kidney, except renal pelvis: Principal | ICD-10-CM | POA: Diagnosis present

## 2015-09-22 DIAGNOSIS — Z808 Family history of malignant neoplasm of other organs or systems: Secondary | ICD-10-CM

## 2015-09-22 DIAGNOSIS — Z833 Family history of diabetes mellitus: Secondary | ICD-10-CM | POA: Diagnosis not present

## 2015-09-22 DIAGNOSIS — Z6841 Body Mass Index (BMI) 40.0 and over, adult: Secondary | ICD-10-CM | POA: Diagnosis not present

## 2015-09-22 DIAGNOSIS — G4730 Sleep apnea, unspecified: Secondary | ICD-10-CM | POA: Diagnosis present

## 2015-09-22 DIAGNOSIS — Z800 Family history of malignant neoplasm of digestive organs: Secondary | ICD-10-CM

## 2015-09-22 DIAGNOSIS — Z85528 Personal history of other malignant neoplasm of kidney: Secondary | ICD-10-CM | POA: Diagnosis present

## 2015-09-22 DIAGNOSIS — K811 Chronic cholecystitis: Secondary | ICD-10-CM | POA: Diagnosis present

## 2015-09-22 DIAGNOSIS — K7581 Nonalcoholic steatohepatitis (NASH): Secondary | ICD-10-CM | POA: Diagnosis present

## 2015-09-22 DIAGNOSIS — Z8249 Family history of ischemic heart disease and other diseases of the circulatory system: Secondary | ICD-10-CM

## 2015-09-22 DIAGNOSIS — K660 Peritoneal adhesions (postprocedural) (postinfection): Secondary | ICD-10-CM | POA: Diagnosis present

## 2015-09-22 HISTORY — PX: CHOLECYSTECTOMY: SHX55

## 2015-09-22 HISTORY — PX: ROBOT ASSISTED LAPAROSCOPIC NEPHRECTOMY: SHX5140

## 2015-09-22 LAB — TYPE AND SCREEN
ABO/RH(D): O POS
ANTIBODY SCREEN: NEGATIVE

## 2015-09-22 LAB — HEMOGLOBIN AND HEMATOCRIT, BLOOD
HEMATOCRIT: 40.3 % (ref 39.0–52.0)
HEMOGLOBIN: 12.9 g/dL — AB (ref 13.0–17.0)

## 2015-09-22 SURGERY — NEPHRECTOMY, RADICAL, ROBOT-ASSISTED, LAPAROSCOPIC, ADULT
Anesthesia: General

## 2015-09-22 MED ORDER — CHLORHEXIDINE GLUCONATE 4 % EX LIQD: 1.0000 "application " | Freq: Once | CUTANEOUS | Status: DC

## 2015-09-22 MED ORDER — FENTANYL CITRATE (PF) 100 MCG/2ML IJ SOLN
INTRAMUSCULAR | Status: AC
  Filled 2015-09-22: qty 2

## 2015-09-22 MED ORDER — ONDANSETRON HCL 4 MG/2ML IJ SOLN
INTRAMUSCULAR | Status: AC
  Filled 2015-09-22: qty 2

## 2015-09-22 MED ORDER — DEXAMETHASONE SODIUM PHOSPHATE 10 MG/ML IJ SOLN
INTRAMUSCULAR | Status: DC | PRN
  Administered 2015-09-22: 10 mg via INTRAVENOUS

## 2015-09-22 MED ORDER — DEXAMETHASONE SODIUM PHOSPHATE 10 MG/ML IJ SOLN
INTRAMUSCULAR | Status: AC
  Filled 2015-09-22: qty 1

## 2015-09-22 MED ORDER — HYDRALAZINE HCL 20 MG/ML IJ SOLN
INTRAMUSCULAR | Status: AC
  Filled 2015-09-22: qty 1

## 2015-09-22 MED ORDER — ONDANSETRON HCL 4 MG/2ML IJ SOLN
INTRAMUSCULAR | Status: DC | PRN
  Administered 2015-09-22: 4 mg via INTRAVENOUS
  Administered 2015-09-22 (×2): 2 mg via INTRAVENOUS

## 2015-09-22 MED ORDER — PROMETHAZINE HCL 25 MG/ML IJ SOLN
6.2500 mg | INTRAMUSCULAR | Status: DC | PRN
  Administered 2015-09-22 (×3): 6.25 mg via INTRAVENOUS
  Filled 2015-09-22 (×4): qty 1

## 2015-09-22 MED ORDER — ONDANSETRON HCL 4 MG/2ML IJ SOLN
4.0000 mg | INTRAMUSCULAR | Status: DC | PRN
  Administered 2015-09-22 – 2015-09-23 (×2): 4 mg via INTRAVENOUS
  Filled 2015-09-22 (×2): qty 2

## 2015-09-22 MED ORDER — LIDOCAINE HCL (CARDIAC) 20 MG/ML IV SOLN
INTRAVENOUS | Status: AC
  Filled 2015-09-22: qty 5

## 2015-09-22 MED ORDER — CEFAZOLIN SODIUM-DEXTROSE 2-4 GM/100ML-% IV SOLN
INTRAVENOUS | Status: AC
  Filled 2015-09-22: qty 100

## 2015-09-22 MED ORDER — CEFAZOLIN SODIUM-DEXTROSE 2-4 GM/100ML-% IV SOLN
2.0000 g | INTRAVENOUS | Status: AC
  Administered 2015-09-22: 1 g via INTRAVENOUS
  Administered 2015-09-22: 2 g via INTRAVENOUS

## 2015-09-22 MED ORDER — FENTANYL CITRATE (PF) 250 MCG/5ML IJ SOLN
INTRAMUSCULAR | Status: AC
  Filled 2015-09-22: qty 5

## 2015-09-22 MED ORDER — ACETAMINOPHEN 10 MG/ML IV SOLN
INTRAVENOUS | Status: DC | PRN
  Administered 2015-09-22: 1000 mg via INTRAVENOUS

## 2015-09-22 MED ORDER — MIDAZOLAM HCL 2 MG/2ML IJ SOLN
INTRAMUSCULAR | Status: AC
  Filled 2015-09-22: qty 2

## 2015-09-22 MED ORDER — PROMETHAZINE HCL 25 MG/ML IJ SOLN
INTRAMUSCULAR | Status: AC
  Administered 2015-09-22: 6.25 mg via INTRAVENOUS
  Filled 2015-09-22: qty 1

## 2015-09-22 MED ORDER — OXYCODONE HCL 5 MG/5ML PO SOLN
5.0000 mg | Freq: Once | ORAL | Status: DC | PRN
  Filled 2015-09-22: qty 5

## 2015-09-22 MED ORDER — SUGAMMADEX SODIUM 500 MG/5ML IV SOLN
INTRAVENOUS | Status: AC
  Filled 2015-09-22: qty 5

## 2015-09-22 MED ORDER — ETOMIDATE 2 MG/ML IV SOLN
INTRAVENOUS | Status: AC
  Filled 2015-09-22: qty 10

## 2015-09-22 MED ORDER — SUGAMMADEX SODIUM 500 MG/5ML IV SOLN
INTRAVENOUS | Status: DC | PRN
  Administered 2015-09-22: 325 mg via INTRAVENOUS

## 2015-09-22 MED ORDER — SUGAMMADEX SODIUM 200 MG/2ML IV SOLN
INTRAVENOUS | Status: AC
  Filled 2015-09-22: qty 2

## 2015-09-22 MED ORDER — HYDRALAZINE HCL 20 MG/ML IJ SOLN
10.0000 mg | Freq: Once | INTRAMUSCULAR | Status: AC
  Administered 2015-09-22: 10 mg via INTRAVENOUS

## 2015-09-22 MED ORDER — BUPIVACAINE LIPOSOME 1.3 % IJ SUSP
20.0000 mL | Freq: Once | INTRAMUSCULAR | Status: AC
  Administered 2015-09-22: 20 mL
  Filled 2015-09-22: qty 20

## 2015-09-22 MED ORDER — DIPHENHYDRAMINE HCL 50 MG/ML IJ SOLN: 12.5000 mg | Freq: Four times a day (QID) | INTRAMUSCULAR | Status: DC | PRN

## 2015-09-22 MED ORDER — MANNITOL 25 % IV SOLN
25.0000 g | Freq: Once | INTRAVENOUS | Status: DC
Start: 2015-09-22 — End: 2015-09-23
  Filled 2015-09-22: qty 100

## 2015-09-22 MED ORDER — PROPOFOL 10 MG/ML IV BOLUS
INTRAVENOUS | Status: DC | PRN
Start: 2015-09-22 — End: 2015-09-22
  Administered 2015-09-22: 300 mg via INTRAVENOUS
  Administered 2015-09-22: 50 mg via INTRAVENOUS

## 2015-09-22 MED ORDER — HYDRALAZINE HCL 20 MG/ML IJ SOLN
INTRAMUSCULAR | Status: DC | PRN
  Administered 2015-09-22: 10 mg via INTRAVENOUS

## 2015-09-22 MED ORDER — CEFAZOLIN SODIUM 1-5 GM-% IV SOLN
INTRAVENOUS | Status: AC
  Filled 2015-09-22: qty 50

## 2015-09-22 MED ORDER — HYDROMORPHONE HCL 1 MG/ML IJ SOLN
0.2500 mg | INTRAMUSCULAR | Status: DC | PRN
  Administered 2015-09-22 (×2): 0.5 mg via INTRAVENOUS

## 2015-09-22 MED ORDER — DIPHENHYDRAMINE HCL 12.5 MG/5ML PO ELIX: 12.5000 mg | ORAL_SOLUTION | Freq: Four times a day (QID) | ORAL | Status: DC | PRN

## 2015-09-22 MED ORDER — ACETAMINOPHEN 500 MG PO TABS
1000.0000 mg | ORAL_TABLET | Freq: Four times a day (QID) | ORAL | Status: AC
  Administered 2015-09-22 – 2015-09-23 (×3): 1000 mg via ORAL
  Filled 2015-09-22 (×4): qty 2

## 2015-09-22 MED ORDER — OXYCODONE HCL 5 MG PO TABS
5.0000 mg | ORAL_TABLET | ORAL | Status: DC | PRN
  Administered 2015-09-23: 5 mg via ORAL
  Filled 2015-09-22: qty 1

## 2015-09-22 MED ORDER — HYDROCODONE-ACETAMINOPHEN 5-325 MG PO TABS: 1.0000 | ORAL_TABLET | Freq: Four times a day (QID) | ORAL | Status: DC | PRN

## 2015-09-22 MED ORDER — SCOPOLAMINE 1 MG/3DAYS TD PT72
MEDICATED_PATCH | TRANSDERMAL | Status: DC | PRN
  Administered 2015-09-22: 1 via TRANSDERMAL

## 2015-09-22 MED ORDER — HYDROMORPHONE HCL 1 MG/ML IJ SOLN
0.5000 mg | INTRAMUSCULAR | Status: DC | PRN
  Administered 2015-09-22: 1 mg via INTRAVENOUS
  Filled 2015-09-22: qty 1

## 2015-09-22 MED ORDER — ROCURONIUM BROMIDE 50 MG/5ML IV SOLN
INTRAVENOUS | Status: AC
  Filled 2015-09-22: qty 1

## 2015-09-22 MED ORDER — FAMOTIDINE-CA CARB-MAG HYDROX 10-800-165 MG PO CHEW: 1.0000 | CHEWABLE_TABLET | Freq: Every day | ORAL | Status: DC

## 2015-09-22 MED ORDER — ACETAMINOPHEN 10 MG/ML IV SOLN
INTRAVENOUS | Status: AC
  Filled 2015-09-22: qty 100

## 2015-09-22 MED ORDER — LACTATED RINGERS IV SOLN
INTRAVENOUS | Status: DC | PRN
  Administered 2015-09-22 (×3): via INTRAVENOUS

## 2015-09-22 MED ORDER — SCOPOLAMINE 1 MG/3DAYS TD PT72
MEDICATED_PATCH | TRANSDERMAL | Status: AC
  Filled 2015-09-22: qty 1

## 2015-09-22 MED ORDER — FAMOTIDINE 10 MG PO TABS
10.0000 mg | ORAL_TABLET | Freq: Every day | ORAL | Status: DC
  Administered 2015-09-22: 10 mg via ORAL
  Filled 2015-09-22 (×2): qty 1

## 2015-09-22 MED ORDER — ROCURONIUM BROMIDE 100 MG/10ML IV SOLN
INTRAVENOUS | Status: DC | PRN
  Administered 2015-09-22: 30 mg via INTRAVENOUS
  Administered 2015-09-22: 70 mg via INTRAVENOUS
  Administered 2015-09-22: 20 mg via INTRAVENOUS

## 2015-09-22 MED ORDER — ACETAMINOPHEN 325 MG PO TABS: 325.0000 mg | ORAL_TABLET | ORAL | Status: DC | PRN

## 2015-09-22 MED ORDER — PHENYLEPHRINE HCL 10 MG/ML IJ SOLN
INTRAMUSCULAR | Status: AC
  Filled 2015-09-22: qty 2

## 2015-09-22 MED ORDER — MAGNESIUM CITRATE PO SOLN
1.0000 | Freq: Once | ORAL | Status: DC
  Filled 2015-09-22: qty 296

## 2015-09-22 MED ORDER — SODIUM CHLORIDE 0.9 % IR SOLN
Status: DC | PRN
  Administered 2015-09-22 (×2): 1000 mL

## 2015-09-22 MED ORDER — ACETAMINOPHEN 160 MG/5ML PO SOLN: 325.0000 mg | ORAL | Status: DC | PRN

## 2015-09-22 MED ORDER — MIDAZOLAM HCL 5 MG/5ML IJ SOLN
INTRAMUSCULAR | Status: DC | PRN
  Administered 2015-09-22: 2 mg via INTRAVENOUS

## 2015-09-22 MED ORDER — SODIUM CHLORIDE 0.9 % IJ SOLN
INTRAMUSCULAR | Status: AC
  Filled 2015-09-22: qty 20

## 2015-09-22 MED ORDER — HYDROMORPHONE HCL 1 MG/ML IJ SOLN
INTRAMUSCULAR | Status: AC
  Administered 2015-09-22: 0.5 mg via INTRAVENOUS
  Filled 2015-09-22: qty 1

## 2015-09-22 MED ORDER — CALCIUM CARBONATE ANTACID 500 MG PO CHEW
1.0000 | CHEWABLE_TABLET | Freq: Every day | ORAL | Status: DC
  Filled 2015-09-22: qty 1

## 2015-09-22 MED ORDER — OXYCODONE HCL 5 MG PO TABS: 5.0000 mg | ORAL_TABLET | Freq: Once | ORAL | Status: DC | PRN

## 2015-09-22 MED ORDER — BUPIVACAINE-EPINEPHRINE (PF) 0.25% -1:200000 IJ SOLN
INTRAMUSCULAR | Status: AC
  Filled 2015-09-22: qty 30

## 2015-09-22 MED ORDER — MANNITOL 25 % IV SOLN
INTRAVENOUS | Status: AC
  Filled 2015-09-22: qty 100

## 2015-09-22 MED ORDER — SODIUM CHLORIDE 0.9 % IJ SOLN
INTRAMUSCULAR | Status: DC | PRN
  Administered 2015-09-22: 20 mL

## 2015-09-22 MED ORDER — PROPOFOL 10 MG/ML IV BOLUS
INTRAVENOUS | Status: AC
  Filled 2015-09-22: qty 40

## 2015-09-22 MED ORDER — DEXTROSE-NACL 5-0.45 % IV SOLN
INTRAVENOUS | Status: DC
  Administered 2015-09-22 – 2015-09-23 (×2): via INTRAVENOUS

## 2015-09-22 MED ORDER — LIDOCAINE HCL (CARDIAC) 20 MG/ML IV SOLN
INTRAVENOUS | Status: DC | PRN
  Administered 2015-09-22: 100 mg via INTRAVENOUS

## 2015-09-22 MED ORDER — BUPIVACAINE-EPINEPHRINE (PF) 0.25% -1:200000 IJ SOLN
INTRAMUSCULAR | Status: DC | PRN
  Administered 2015-09-22: 19 mL

## 2015-09-22 MED ORDER — NITROPRUSSIDE SODIUM 25 MG/ML IV SOLN
0.0000 ug/kg/min | Freq: Once | INTRAVENOUS | Status: DC
  Filled 2015-09-22: qty 2

## 2015-09-22 MED ORDER — FENTANYL CITRATE (PF) 100 MCG/2ML IJ SOLN
INTRAMUSCULAR | Status: DC | PRN
  Administered 2015-09-22: 50 ug via INTRAVENOUS
  Administered 2015-09-22: 150 ug via INTRAVENOUS
  Administered 2015-09-22: 100 ug via INTRAVENOUS
  Administered 2015-09-22 (×3): 50 ug via INTRAVENOUS

## 2015-09-22 SURGICAL SUPPLY — 50 items
CHLORAPREP W/TINT 26ML (MISCELLANEOUS) ×3 IMPLANT
CLIP LIGATING HEM O LOK PURPLE (MISCELLANEOUS) ×6 IMPLANT
CLIP LIGATING HEMO LOK XL GOLD (MISCELLANEOUS) ×6 IMPLANT
CLIP LIGATING HEMO O LOK GREEN (MISCELLANEOUS) ×3 IMPLANT
COVER TIP SHEARS 8 DVNC (MISCELLANEOUS) ×1 IMPLANT
COVER TIP SHEARS 8MM DA VINCI (MISCELLANEOUS) ×2
DECANTER SPIKE VIAL GLASS SM (MISCELLANEOUS) ×3 IMPLANT
DRAIN CHANNEL 15F RND FF 3/16 (WOUND CARE) IMPLANT
DRAPE ARM DVNC X/XI (DISPOSABLE) ×4 IMPLANT
DRAPE COLUMN DVNC XI (DISPOSABLE) ×1 IMPLANT
DRAPE DA VINCI XI ARM (DISPOSABLE) ×8
DRAPE DA VINCI XI COLUMN (DISPOSABLE) ×2
DRAPE INCISE IOBAN 66X45 STRL (DRAPES) ×3 IMPLANT
DRAPE LAPAROSCOPIC ABDOMINAL (DRAPES) ×3 IMPLANT
DRAPE SHEET LG 3/4 BI-LAMINATE (DRAPES) ×3 IMPLANT
ELECT PENCIL ROCKER SW 15FT (MISCELLANEOUS) ×3 IMPLANT
ELECT REM PT RETURN 9FT ADLT (ELECTROSURGICAL) ×6
ELECTRODE REM PT RTRN 9FT ADLT (ELECTROSURGICAL) ×2 IMPLANT
EVACUATOR SILICONE 100CC (DRAIN) IMPLANT
GLOVE BIO SURGEON STRL SZ 6.5 (GLOVE) ×2 IMPLANT
GLOVE BIO SURGEONS STRL SZ 6.5 (GLOVE) ×1
GLOVE BIOGEL M STRL SZ7.5 (GLOVE) ×6 IMPLANT
GOWN STRL REUS W/TWL LRG LVL3 (GOWN DISPOSABLE) ×9 IMPLANT
HEMOSTAT SURGICEL 2X3 (HEMOSTASIS) ×3 IMPLANT
KIT BASIN OR (CUSTOM PROCEDURE TRAY) ×3 IMPLANT
LIQUID BAND (GAUZE/BANDAGES/DRESSINGS) ×3 IMPLANT
LOOP VESSEL MAXI BLUE (MISCELLANEOUS) ×3 IMPLANT
NEEDLE BIOPSY 14GX4.5 SOFT TIS (NEEDLE) ×6 IMPLANT
NEEDLE INSUFFLATION 14GA 120MM (NEEDLE) ×3 IMPLANT
POUCH ENDO CATCH II 15MM (MISCELLANEOUS) ×3 IMPLANT
RELOAD STAPLER WHITE 60MM (STAPLE) ×2 IMPLANT
SEAL CANN UNIV 5-8 DVNC XI (MISCELLANEOUS) ×4 IMPLANT
SEAL XI 5MM-8MM UNIVERSAL (MISCELLANEOUS) ×8
SET TUBE IRRIG SUCTION NO TIP (IRRIGATION / IRRIGATOR) ×3 IMPLANT
SPONGE LAP 4X18 X RAY DECT (DISPOSABLE) ×3 IMPLANT
STAPLE ECHEON FLEX 60 POW ENDO (STAPLE) IMPLANT
STAPLER RELOAD WHITE 60MM (STAPLE) ×6
SUT ETHILON 3 0 PS 1 (SUTURE) IMPLANT
SUT MNCRL AB 4-0 PS2 18 (SUTURE) ×12 IMPLANT
SUT PDS AB 1 CT1 27 (SUTURE) ×9 IMPLANT
SUT VICRYL 0 UR6 27IN ABS (SUTURE) IMPLANT
TAPE STRIPS DRAPE STRL (GAUZE/BANDAGES/DRESSINGS) IMPLANT
TOWEL OR NON WOVEN STRL DISP B (DISPOSABLE) ×6 IMPLANT
TRAY FOLEY W/METER SILVER 14FR (SET/KITS/TRAYS/PACK) IMPLANT
TRAY FOLEY W/METER SILVER 16FR (SET/KITS/TRAYS/PACK) ×3 IMPLANT
TRAY LAPAROSCOPIC (CUSTOM PROCEDURE TRAY) ×3 IMPLANT
TROCAR BLADELESS OPT 12M 100M (ENDOMECHANICALS) ×3 IMPLANT
TROCAR BLADELESS OPT 5 100 (ENDOMECHANICALS) IMPLANT
TROCAR UNIVERSAL OPT 12M 100M (ENDOMECHANICALS) ×6 IMPLANT
WATER STERILE IRR 1500ML POUR (IV SOLUTION) ×6 IMPLANT

## 2015-09-22 NOTE — Brief Op Note (Signed)
09/22/2015  11:53 AM  PATIENT:  Edwin Rogers  44 y.o. male  PRE-OPERATIVE DIAGNOSIS:  LEFT RENAL MASS symptomatic biliary colic, probable chronic cholecystitis  POST-OPERATIVE DIAGNOSIS:  LEFT RENAL MASS symptomatic biliary colic,   PROCEDURE:  Procedure(s): XI ROBOTIC ASSISTED LAPAROSCOPIC LEFT RADIAL NEPHRECTOMY WITH EXTENSIVE LAPAROSCOPIC ADHESIOLYSIS  (N/A) XI ROBOTIC ASSISTED LAPAROSCOPIC CHOLECYSTECTOMY WITH TRU CUT LIVER BIOPSY (N/A)  SURGEON:  Surgeon(s) and Role: Panel 1:    * Alexis Frock, MD - Primary  Panel 2:    * Michael Boston, MD - Primary  PHYSICIAN ASSISTANT:   ASSISTANTS: Debbrah Alar, PA   ANESTHESIA:   general  EBL:  Total I/O In: 2000 [I.V.:2000] Out: 450 [Urine:400; Blood:50]  BLOOD ADMINISTERED:none  DRAINS: foley to gravity   LOCAL MEDICATIONS USED:  MARCAINE     SPECIMEN:  Source of Specimen:  left radical nephrectomy  DISPOSITION OF SPECIMEN:  PATHOLOGY  COUNTS:  YES  TOURNIQUET:  * No tourniquets in log *  DICTATION: .Other Dictation: Dictation Number [id number]  PLAN OF CARE: Admit to inpatient   PATIENT DISPOSITION:  PACU - hemodynamically stable.   Delay start of Pharmacological VTE agent (>24hrs) due to surgical blood loss or risk of bleeding: yes

## 2015-09-22 NOTE — Discharge Instructions (Signed)
1- Drain Sites - You may have some mild persistent drainage from old drain site for several days, this is normal. This can be covered with cotton gauze for convenience.  2 - Stiches - Your stitches are all dissolvable. You may notice a "loose thread" at your incisions, these are normal and require no intervention. You may cut them flush to the skin with fingernail clippers if needed for comfort.  3 - Diet - No restrictions  4 - Activity - No lifting over 10 pounds / straining (any activities that require valsalva or "bearing down") x 6 weeks. Otherwise, no restrictions.  5 - Bathing - You may shower immediately. Do not take a bath or get into swimming pool where incision sites are submersed in water x 4 weeks.   6 -  When to Call the Doctor - Call MD for any fever >102, any acute wound problems, or any severe nausea / vomiting. You can call the [facility] [phone]) 24 hours a day 365 days a year. It will roll-over to the answering service and on-call physician after hours.  GETTING TO GOOD BOWEL HEALTH. Irregular bowel habits such as constipation and diarrhea can lead to many problems over time.  Having one soft bowel movement a day is the most important way to prevent further problems.  The anorectal canal is designed to handle stretching and feces to safely manage our ability to get rid of solid waste (feces, poop, stool) out of our body.  BUT, hard constipated stools can act like ripping concrete bricks and diarrhea can be a burning fire to this very sensitive area of our body, causing inflamed hemorrhoids, anal fissures, increasing risk is perirectal abscesses, abdominal pain/bloating, an making irritable bowel worse.      The goal: ONE SOFT BOWEL MOVEMENT A DAY!  To have soft, regular bowel movements:   Drink plenty of fluids, consider 4-6 tall glasses of water a day.    Take plenty of fiber.  Fiber is the undigested part of plant food that passes into the colon, acting s  natures broom to encourage bowel motility and movement.  Fiber can absorb and hold large amounts of water. This results in a larger, bulkier stool, which is soft and easier to pass. Work gradually over several weeks up to 6 servings a day of fiber (25g a day even more if needed) in the form of: o Vegetables -- Root (potatoes, carrots, turnips), leafy green (lettuce, salad greens, celery, spinach), or cooked high residue (cabbage, broccoli, etc) o Fruit -- Fresh (unpeeled skin & pulp), Dried (prunes, apricots, cherries, etc ),  or stewed ( applesauce)  o Whole grain breads, pasta, etc (whole wheat)  o Bran cereals   Bulking Agents -- This type of water-retaining fiber generally is easily obtained each day by one of the following:  o Psyllium bran -- The psyllium plant is remarkable because its ground seeds can retain so much water. This product is available as Metamucil, Konsyl, Effersyllium, Per Diem Fiber, or the less expensive generic preparation in drug and health food stores. Although labeled a laxative, it really is not a laxative.  o Methylcellulose -- This is another fiber derived from wood which also retains water. It is available as Citrucel. o Polyethylene Glycol - and artificial fiber commonly called Miralax or Glycolax.  It is helpful for people with gassy or bloated feelings with regular fiber o Flax Seed - a less gassy fiber than psyllium  No reading or other relaxing  activity while on the toilet. If bowel movements take longer than 5 minutes, you are too constipated  AVOID CONSTIPATION.  High fiber and water intake usually takes care of this.  Sometimes a laxative is needed to stimulate more frequent bowel movements, but   Laxatives are not a good long-term solution as it can wear the colon out.  They can help jump-start bowels if constipated, but should be relied on constantly without discussing with your doctor o Osmotics (Milk of Magnesia, Fleets phosphosoda, Magnesium citrate,  MiraLax, GoLytely) are safer than  o Stimulants (Senokot, Castor Oil, Dulcolax, Ex Lax)    o Avoid taking laxatives for more than 7 days in a row.   IF SEVERELY CONSTIPATED, try a Bowel Retraining Program: o Do not use laxatives.  o Eat a diet high in roughage, such as bran cereals and leafy vegetables.  o Drink six (6) ounces of prune or apricot juice each morning.  o Eat two (2) large servings of stewed fruit each day.  o Take one (1) heaping tablespoon of a psyllium-based bulking agent twice a day. Use sugar-free sweetener when possible to avoid excessive calories.  o Eat a normal breakfast.  o Set aside 15 minutes after breakfast to sit on the toilet, but do not strain to have a bowel movement.  o If you do not have a bowel movement by the third day, use an enema and repeat the above steps.   Controlling diarrhea o Switch to liquids and simpler foods for a few days to avoid stressing your intestines further. o Avoid dairy products (especially milk & ice cream) for a short time.  The intestines often can lose the ability to digest lactose when stressed. o Avoid foods that cause gassiness or bloating.  Typical foods include beans and other legumes, cabbage, broccoli, and dairy foods.  Every person has some sensitivity to other foods, so listen to our body and avoid those foods that trigger problems for you. o Adding fiber (Citrucel, Metamucil, psyllium, Miralax) gradually can help thicken stools by absorbing excess fluid and retrain the intestines to act more normally.  Slowly increase the dose over a few weeks.  Too much fiber too soon can backfire and cause cramping & bloating. o Probiotics (such as active yogurt, Align, etc) may help repopulate the intestines and colon with normal bacteria and calm down a sensitive digestive tract.  Most studies show it to be of mild help, though, and such products can be costly. o Medicines: - Bismuth subsalicylate (ex. Kayopectate, Pepto Bismol) every 30  minutes for up to 6 doses can help control diarrhea.  Avoid if pregnant. - Loperamide (Immodium) can slow down diarrhea.  Start with two tablets (4mg  total) first and then try one tablet every 6 hours.  Avoid if you are having fevers or severe pain.  If you are not better or start feeling worse, stop all medicines and call your doctor for advice o Call your doctor if you are getting worse or not better.  Sometimes further testing (cultures, endoscopy, X-ray studies, bloodwork, etc) may be needed to help diagnose and treat the cause of the diarrhea.  TROUBLESHOOTING IRREGULAR BOWELS 1) Avoid extremes of bowel movements (no bad constipation/diarrhea) 2) Miralax 17gm mixed in 8oz. water or juice-daily. May use BID as needed.  3) Gas-x,Phazyme, etc. as needed for gas & bloating.  4) Soft,bland diet. No spicy,greasy,fried foods.  5) Prilosec over-the-counter as needed  6) May hold gluten/wheat products from diet to see if  symptoms improve.  7)  May try probiotics (Align, Activa, etc) to help calm the bowels down 7) If symptoms become worse call back immediately.  Managing Pain  Pain after surgery or related to activity is often due to strain/injury to muscle, tendon, nerves and/or incisions.  This pain is usually short-term and will improve in a few months.   Many people find it helpful to do the following things TOGETHER to help speed the process of healing and to get back to regular activity more quickly:  1. Avoid heavy physical activity at first a. No lifting greater than 10 pounds for 6 weeks. b. Do not push through the pain.  Listen to your body and avoid positions and maneuvers than reproduce the pain.  Wait a few days before trying something more intense c. Walk every hour to one hour and half every day for the first week after surgery.   d. Remember: If it hurts to do it, then dont do it!  2. Take Acetaminophen Anti-inflammatory medication i. Acetaminophen 500mg  tabs (Tylenol) 1-2  pills with every meal and just before bedtime (avoid if you have liver problems) ii. Take with food/snack around the clock for 1-2 weeks iii. This helps the muscle and nerve tissues become less irritable and calm down faster  3. Use a Heating pad or Ice/Cold Pack a. 4-6 times a day b. May use warm bath/hottub  or showers  4. Try Gentle Massage and/or Stretching  a. at the area of pain many times a day b. stop if you feel pain - do not overdo it  Try these steps together to help you body heal faster and avoid making things get worse.  Doing just one of these things may not be enough.    If you are not getting better after two weeks or are noticing you are getting worse, contact our office for further advice; we may need to re-evaluate you & see what other things we can do to help.  Cholecystitis Cholecystitis is inflammation of the gallbladder. It is often called a gallbladder attack. The gallbladder is a pear-shaped organ that lies beneath the liver on the right side of the body. The gallbladder stores bile, which is a fluid that helps the body to digest fats. If bile builds up in your gallbladder, your gallbladder becomes inflamed. This condition may occur suddenly (be acute). Repeat episodes of acute cholecystitis or prolonged episodes may lead to a long-term (chronic) condition. Cholecystitis is serious and it requires treatment.  CAUSES The most common cause of this condition is gallstones. Gallstones can block the tube (duct) that carries bile out of your gallbladder. This causes bile to build up. Other causes of this condition include:  Damage to the gallbladder due to a decrease in blood flow.  Infections in the bile ducts.  Scars or kinks in the bile ducts.  Tumors in the liver, pancreas, or gallbladder. RISK FACTORS This condition is more likely to develop in:  People who have sickle cell disease.  People who take birth control pills or use estrogen.  People who have  alcoholic liver disease.  People who have liver cirrhosis.  People who have their nutrition delivered through a vein (parenteral nutrition).  People who do not eat or drink (do fasting) for a long period of time.  People who are obese.  People who have rapid weight loss.  People who are pregnant.  People who have increased triglyceride levels.  People who have pancreatitis. SYMPTOMS Symptoms of  this condition include:  Abdominal pain, especially in the upper right area of the abdomen.  Abdominal tenderness or bloating.  Nausea.  Vomiting.  Fever.  Chills.  Yellowing of the skin and the whites of the eyes (jaundice). DIAGNOSIS This condition is diagnosed with a medical history and physical exam. You may also have other tests, including:  Imaging tests, such as:  An ultrasound of the gallbladder.  A CT scan of the abdomen.  A gallbladder nuclear scan (HIDA scan). This scan allows your health care provider to see the bile moving from your liver to your gallbladder and to your small intestine.  MRI.  Blood tests, such as:  A complete blood count, because the white blood cell count may be higher than normal.  Liver function tests, because some levels may be higher than normal with certain types of gallstones. TREATMENT Treatment may include:  Fasting for a certain amount of time.  IV fluids.  Medicine to treat pain or vomiting.  Antibiotic medicine.  Surgery to remove your gallbladder (cholecystectomy). This may happen immediately or at a later time. [place] care will depend on your treatment. In general:  Take over-the-counter and prescription medicines only as told by your health care provider.  If you were prescribed an antibiotic medicine, take it as told by your health care provider. Do not stop taking the antibiotic even if you start to feel better.  Follow instructions from your health care provider about what to eat or  drink. When you are allowed to eat, avoid eating or drinking anything that triggers your symptoms.  Keep all follow-up visits as told by your health care provider. This is important. SEEK MEDICAL CARE IF:  Your pain is not controlled with medicine.  You have a fever. SEEK IMMEDIATE MEDICAL CARE IF:  Your pain moves to another part of your abdomen or to your back.  You continue to have symptoms or you develop new symptoms even with treatment.   This information is not intended to replace advice given to you by your health care provider. Make sure you discuss any questions you have with your health care provider.   Document Released: 05/07/2005 Document Revised: 01/26/2015 Document Reviewed: 08/18/2014 Elsevier Interactive Patient Education [employer].

## 2015-09-22 NOTE — Op Note (Signed)
09/22/2015  9:51 AM  PATIENT:  Edwin Rogers  45 y.o. male  Patient Care Team: Redmond School, MD as PCP - General (Internal Medicine) Michael Boston, MD as Consulting Physician (General Surgery) Rogene Houston, MD as Consulting Physician (Gastroenterology) Alexis Frock, MD as Consulting Physician (Urology)  PRE-OPERATIVE DIAGNOSIS:    LEFT RENAL MASS  symptomatic biliary colic, probable chronic cholecystitis  POST-OPERATIVE DIAGNOSIS:    LEFT RENAL MASS  symptomatic biliary colic, probable chronic cholecystitis probable steatohepatitis.   PROCEDURE:    XI ROBOTIC ASSISTED LAPAROSCOPIC CHOLECYSTECTOMY Core needle liver biopsy 3  SURGEON:  Michael Boston, MD  ASSISTANT:  Alexis Frock, MD  ANESTHESIA:   local and general  EBL:  Total I/O In: 1000 [I.V.:1000] Out: 250 [Urine:250]  Delay start of Pharmacological VTE agent (>24hrs) due to surgical blood loss or risk of bleeding:  no  DRAINS: none   SPECIMEN:  Source of Specimen:  Gallbladder  DISPOSITION OF SPECIMEN:  PATHOLOGY  COUNTS:  YES  PLAN OF CARE: In OR for nephrectomy as well  PATIENT DISPOSITION:  PACU - hemodynamically stable.  INDICATION: Pleasant obese gentleman with classic episodes of nausea vomiting and abdominal pain suspicious for biliary dyskinesia/chronic cholecystitis.  Rest of the differential diagnosis seems unlikely.  I offered cholecystectomy.  Patient needs partial nephrectomy for a renal masses well.  We worked to coordinate with urology to do the procedures at the same operative time.  The anatomy & physiology of hepatobiliary & pancreatic function was discussed.  The pathophysiology of gallbladder dysfunction was discussed.  Natural history risks without surgery was discussed.   I feel the risks of no intervention will lead to serious problems that outweigh the operative risks; therefore, I recommended cholecystectomy to remove the pathology.  I explained laparoscopic techniques  with possible need for an open approach.  Probable cholangiogram to evaluate the bilary tract was explained as well.    Risks such as bleeding, infection, abscess, leak, injury to other organs, need for further treatment, heart attack, death, and other risks were discussed.  I noted a good likelihood this will help address the problem.  Possibility that this will not correct all abdominal symptoms was explained.  Goals of post-operative recovery were discussed as well.  We will work to minimize complications.  An educational handout further explaining the pathology and treatment options was given as well.  Questions were answered.  The patient expresses understanding & wishes to proceed with surgery.  OR FINDINGS: Dilated boggy gallbladder suspicious for chronic cholecystitis.  No obvious gallstones.  Thickened fatty liver suspicious for steatohepatitis.  Core biopsies done through right anterior hepatic lobe.  Segment 4.  DESCRIPTION:   The patient was identified & brought into the operating room. The patient was positioned supine with arms tucked. SCDs were active during the entire case. The patient underwent general anesthesia without any difficulty.  The abdomen was prepped and draped in a sterile fashion. A Surgical Timeout confirmed our plan.  We positioned the patient in reverse [place] & right side up.  Then placed in 46mm robotic port in the right upper quadrant using a very stay technique with a trach hook and fascial countertraction.  Entry was clean.  We induced carbon dioxide insufflation. Camera inspection revealed no injury. There were no adhesions to the anterior abdominal wall supraumbilically.  I proceeded to continue with minimally invasive technique.  Placed a 12 mm errors CO Portz periumbilically 2.  Placed 41mm robotic port in the left upper quadrant.  These  were locations Dr. Bess Harvest was anticipated needing for his nephrectomy.  I turned attention to the right upper quadrant.    The gallbladder fundus was elevated cephalad. I used hook cautery to free the peritoneal coverings between the gallbladder and the liver on the posteriolateral and anteriomedial walls.   I used careful blunt and hook dissection to help get a good critical view of the cystic artery and cystic duct. I did further dissection to free a few centimeters of the  gallbladder off the liver bed to get a good critical view of the infundibulum and cystic duct. I mobilized the cystic artery; and, after getting a good 360 view, ligated the cystic artery using locking plastic clip on the proximal side and cautery on the gallbladder side.. I skeletonized the cystic duct.  Had a good classic critical view.  I placed a locking plastic clips distally 1 on the infundibulum.  Then 2 locking plastic clips more proximally closer to the common bile duct junction but not on it.  I completed cystic duct transection. I freed the gallbladder from its remaining attachments to the liver. I ensured hemostasis on the gallbladder fossa of the liver and elsewhere. I inspected the rest of the abdomen & detected no injury nor bleeding elsewhere.  We removed the gallbladder inside an Endo Catch bag.  Because he did not have stones and had some right upper quadrant pain with an abnormal appearing liver, we proceeded with core liver biopsies.  Used a 14-gauge TruCut core biopsy needle through a right subcostal puncture site.  Got 3 good cores through the anterior right hepatic lobe.  Near segment for just cephalad to the gallbladder fossa.  I assured hemostasis.  We did reinspection and irrigation.  Hemostasis was good.  No leaking bile.  Clips intact.  No other abnormalities.  Evacuated carbon dioxide.  Removed the ports.  I closed a right upper quadrant port site with Monocryl suture.  Dermabond placed over the right subcostal puncture site and right upper quadrant port site.  At this point I relieved the case to Dr. Tresa Moore so that they could  reposition the patient and proceed with left nephrectomy.  See his notes for details.  The patient had received postoperative care instructions in the office to her visit.  Postoperative cholecystectomy instructions are written in the chart as well.  [name], M.D., F.A.C.S. Gastrointestinal and Minimally Invasive Surgery [facility] [address], [place], [place] [address] [phone] Main / Paging

## 2015-09-22 NOTE — Anesthesia Procedure Notes (Signed)
Procedure Name: Intubation Date/Time: 09/22/2015 8:31 AM Performed by: Freddie Breech Pre-anesthesia Checklist: Patient identified, Emergency Drugs available, Suction available, Patient being monitored and Timeout performed Patient Re-evaluated:Patient Re-evaluated prior to inductionOxygen Delivery Method: Circle system utilized Preoxygenation: Pre-oxygenation with 100% oxygen Intubation Type: IV induction Ventilation: Mask ventilation without difficulty and Oral airway inserted - appropriate to patient size Laryngoscope Size: Mac and 4 Grade View: Grade III Tube type: Oral Tube size: 8.0 mm Number of attempts: 1 Airway Equipment and Method: Patient positioned with wedge pillow and Stylet Placement Confirmation: ETT inserted through vocal cords under direct vision,  positive ETCO2,  CO2 detector and breath sounds checked- equal and bilateral Secured at: 24 cm Tube secured with: Tape Dental Injury: Teeth and Oropharynx as per pre-operative assessment

## 2015-09-22 NOTE — Anesthesia Postprocedure Evaluation (Signed)
Anesthesia Post Note  Patient: Edwin Rogers  Procedure(s) Performed: Procedure(s) (LRB): XI ROBOTIC ASSISTED LAPAROSCOPIC LEFT RADIAL NEPHRECTOMY WITH EXTENSIVE LAPAROSCOPIC ADHESIOLYSIS  (N/A) XI ROBOTIC ASSISTED LAPAROSCOPIC CHOLECYSTECTOMY WITH TRU CUT LIVER BIOPSY (N/A)  Patient location during evaluation: PACU Anesthesia Type: General Level of consciousness: awake Pain management: pain level controlled Vital Signs Assessment: post-procedure vital signs reviewed and stable Respiratory status: spontaneous breathing Cardiovascular status: stable Postop Assessment: no signs of nausea or vomiting Anesthetic complications: no    Last Vitals:  Filed Vitals:   09/22/15 1315 09/22/15 1330  BP:  158/96  Pulse: 73 66  Temp:    Resp: 15 14    Last Pain:  Filed Vitals:   09/22/15 1332  PainSc: Asleep                 Thelonious Steven Veazie

## 2015-09-22 NOTE — Op Note (Signed)
Edwin Rogers, Edwin Rogers        ACCOUNT NO.:  [id number]  MEDICAL RECORD NO.:  [id number]  LOCATION:  [id number]                         FACILITY:  [facility]  PHYSICIAN:  Alexis Frock, MD     DATE OF BIRTH:  07/22/1970  DATE OF PROCEDURE:                              OPERATIVE REPORT  DIAGNOSIS:  Left renal mass.  PROCEDURES: 1. Robotic-assisted laparoscopic left radical nephrectomy. 2. Laparoscopic extensive adhesiolysis.  ESTIMATED BLOOD LOSS:  Less than 100 mL.  ASSISTANT:  Debbrah Alar, PA.  FINDINGS: 1. Single artery, single vein, left renovascular anatomy as     anticipated. 2. Mass effects from upper pole mass as anticipated. 3. Significant dense omental and peri-epiploic fat adhesions in the     left upper quadrant.  This appeared to be due to possible prior     episodes of colitis or diverticulitis given the distribution, this     did require approximately at least 30 minutes of dedicated     adhesiolysis.  INDICATION:  Mr. 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  Mass was at least 5 cm and has a quite endophytic component of the tumor abutting sinus fat towards the area of the main renal vein branch, this was somewhat concerning for possible even T3 disease.  No contralateral lesions noted and he has normal renal function.  Options were discussed for management of this mass including surveillance protocols versus ablative therapy versus surgical extirpation with and without minimally invasive assistance and with and without nephron sparing, and we all agreed the most definitive approach would be left radical nephrectomy and he wished to proceed with this with minimally invasive assistance.  Notably, the patient also has acalculous cystitis with need for cholecystectomy and liver biopsy.  He has seen Dr. Michael Rogers for this and we  have agreed that it would be prudent to coordinate this to perform concomitantly with single anesthetic.  Informed consent was obtained and placed in the medical record.  PROCEDURE IN DETAIL:  The patient being Edwin Rogers, was verified.  Procedure being left robotic radical nephrectomy was confirmed.  Procedure was carried out.  Time-out was performed. Intravenous antibiotics had already been administered.  He was already under general endotracheal anesthesia.  The patient already had his robotic cholecystectomy and liver biopsy, which occurred in uncomplicated fashion in a supine position for this with applicable dual- used port sites been covered with a sterile Ioban.  He was then repositioned into a left side up full flank position, applying 15 degrees of stable flexion, superior arm elevator, axillary roll, sequential compression devices, bottom leg bent, top leg straight. Beanbag was used as was further fashioned on the operative table using 3- inch tape over foam padding across his supraxiphoid abdomen and his pelvis.  Charlie Pitter was removed.  Then, a new sterile field was created by prepping and draping the patient's entire left flank including all of the dual-used port site using chlorhexidine gluconate.  A high-flow, low- pressure pneumoperitoneum was re-established by placing the AirSeal port back into a 12-mm port within the paramedian location just superior to the umbilicus.  Next, a 12-mm assistant port was placed as was previous paramedian location.  Above his umbilicus, an 8-mm robotic port and positioned approximately 1/2 handbreadth superomedial to the umbilicus and a left subcostal robotic port site.  These port sites all represented once that had been previously used during his cholecystectomy.  Two additional ports were then placed.  A left far lateral 8-mm robotic port approximately 4 fingerbreadths superomedial to the anterior iliac spine and a left paramedian  inferior robotic port site approximately 1 handbreadth superior to the pubic ramus.  Robot was docked and passed through the electronic checks.  Initial attention was directed at development of the left space of Retzius and hemiabdomen. Immediately, there was noted to be dense adhesions predominantly of omentum and descending colon and pericolonic fat most dense at the area of the splenic flexure.  Very careful adhesiolysis was performed using robotic cold scissors, taking these adhesions away from the left lateral abdominal wall and also allowing the colon to very carefully be swept medially and inferiorly allowing visualization of the anterior surface of Gerota's fascia.  This dedicated adhesiolysis took approximately additional 30 minutes operative time.  Exquisite care taken to avoid any bowel injury, which did not occur.  Lower pole of the kidney was then placed on gentle lateral traction.  Dissection was proceeded medial to this.  The gonadal vein and ureter were encountered and also placed on gentle lateral traction.  Psoas muscle was identified as was the lateral aspect of the aorta, dissection proceeded within this triangle superiorly towards the area of the left renal hilum.  Left renal hilum consisted of single artery, single vein, left renovascular anatomy as anticipated.  There was an additional small lumbar vessel that appeared to be originating from the renal vessels towards the area of the psoas, this was controlled using Hem-O-Lok clip x2.  The renal artery was then controlled using extra large Hem-O-Lok clip proximally with vascular load stapler distal to this and the vein was controlled using vascular load stapler.  This resulted in excellent hemostatic control of the hilum.  Additional superomedial attachments were taken down, thus performing partial adrenal sparing.  This completely dissected the medial plane of the kidney.  Superior aspect was carefully  dissected free as was the lateral aspect and inferiorly, the ureter was doubly clipped and ligated as was the gonadal vein.  The proximal portion of which had been transected by taking the renal vein proximal to its origin.  This completely freed up the left radical nephrectomy specimen, which was placed into an extra large EndoCatch bag.  The robot was then undocked.  Specimen was retrieved by extending the previous two paramedian assistant port sites, removing the specimen and setting it aside for permanent pathology.  This extraction site was then closed at the level of the fascia using interrupted PDS x6 followed by reapproximation of the Scarpa's with running Vicryl.  All incision sites were infiltrated with dilute lyophilized Marcaine and closed at the level of the skin using subcuticular Monocryl followed by Dermabond and the procedure was then terminated.  The patient tolerated the procedure well.  There were no immediate periprocedural complications.  The patient was taken to the postanesthesia care unit in stable condition.          ______________________________ Alexis Frock, MD     TM/MEDQ  D:  09/22/2015  T:  09/22/2015  Job:  [id number]

## 2015-09-22 NOTE — Anesthesia Preprocedure Evaluation (Signed)
Anesthesia Evaluation  Patient identified by MRN, date of birth, ID band Patient awake    Reviewed: Allergy & Precautions, NPO status , Patient's Chart, lab work & pertinent test results  History of Anesthesia Complications Negative for: history of anesthetic complications  Airway Mallampati: III  TM Distance: >3 FB Neck ROM: Full    Dental  (+) Teeth Intact   Pulmonary neg shortness of breath, sleep apnea and Continuous Positive Airway Pressure Ventilation , neg recent URI,    breath sounds clear to auscultation       Cardiovascular hypertension,  Rhythm:Regular     Neuro/Psych negative neurological ROS  negative psych ROS   GI/Hepatic Neg liver ROS, GERD  Medicated and Controlled,  Endo/Other  Morbid obesity  Renal/GU Left renal mass, adrenal mass, neg symptoms and met studies     Musculoskeletal   Abdominal   Peds  Hematology   Anesthesia Other Findings   Reproductive/Obstetrics                             Anesthesia Physical Anesthesia Plan  ASA: II  Anesthesia Plan: General   Post-op Pain Management:    Induction: Intravenous  Airway Management Planned: Oral ETT  Additional Equipment: Arterial line  Intra-op Plan:   Post-operative Plan: Extubation in OR  Informed Consent: I have reviewed the patients History and Physical, chart, labs and discussed the procedure including the risks, benefits and alternatives for the proposed anesthesia with the patient or authorized representative who has indicated his/her understanding and acceptance.   Dental advisory given  Plan Discussed with: CRNA and Surgeon  Anesthesia Plan Comments:         Anesthesia Quick Evaluation

## 2015-09-22 NOTE — H&P (Signed)
Emmerson Stensrud is an 45 y.o. male.    Chief Complaint: Pre-op Left Robotic Radical Nephrectomy and Laparoscopic Cholecytectomy  HPI:  1 - Left Enhancing Renal Mass - approx 5cm left cystic solid renal mass incidetnal on CT eval nausea / emesis 06/2015 and confirmed on MRI 07/2015. Deep aspect of mass quite diffuse in its interface with kidney and apparent nodule of tumor that dives very deep into sinus fat and origin of renal vein (?T3 disease). 1 artery / 1 vein left renovascular anatomy.  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 No h/o refracotry HTN, diabetes. CMP, Cortisol, Metanephrines all normal 2017.   PMH sig for obesity, GERD, acalculous cholecystitis. HIs PCP is Dr. Gerarda Fraction.   Today " Gerald Stabs " is seen to proceed with left robotic radical nephrectomy by Urol team as well as lap chole by gen surg team in combined case.   Past Medical History  Diagnosis Date  . Sleep apnea   . GERD (gastroesophageal reflux disease)   . Hypertension     pt states currently on no medications  . Left groin hernia     Past Surgical History  Procedure Laterality Date  . Orif ankle fracture  05/10/2012    Procedure: OPEN REDUCTION INTERNAL FIXATION (ORIF) ANKLE FRACTURE;  Surgeon: Marin Shutter, MD;  Location: [facility];  Service: Orthopedics;  Laterality: Right;  . Ankle fracture surgery      last year with a plate  . Colonoscopy N/A 05/27/2013    Procedure: COLONOSCOPY;  Surgeon: Rogene Houston, MD;  Location: [facility];  Service: Endoscopy;  Laterality: N/A;  100    Family History  Problem Relation Age of Onset  . Colon cancer Father   . Colon cancer Other   . Colon cancer Other    Social History:  reports that he has never smoked. He has never used smokeless tobacco. He reports that he does not drink alcohol or use illicit drugs.  Allergies:  Allergies  Allergen Reactions  . Other Nausea And Vomiting    ALL PAIN MEDS     Medications Prior to Admission  Medication Sig Dispense Refill  . famotidine-calcium carbonate-magnesium hydroxide (PEPCID COMPLETE) 10-800-165 MG CHEW chewable tablet Chew 1 tablet by mouth daily. Heartburn    . naproxen sodium (ANAPROX) 220 MG tablet Take 220 mg by mouth 2 (two) times daily with a meal.      No results found for this or any previous visit (from the past 48 hour(s)). No results found.  Review of Systems  Constitutional: Negative.  Negative for fever.  HENT: Negative.   Eyes: Negative.   Respiratory: Negative.   Cardiovascular: Negative.   Gastrointestinal: Negative.   Genitourinary: Negative.   Musculoskeletal: Negative.   Skin: Negative.   Neurological: Negative.   Endo/Heme/Allergies: Negative.  Does not bruise/bleed easily.  Psychiatric/Behavioral: Negative.     Blood pressure 153/103, pulse 76, temperature 98.3 F (36.8 C), temperature source Oral, resp. rate 18, height 6\' 3"  (1.905 m), weight 156.037 kg (344 lb), SpO2 99 %. Physical Exam  Constitutional: He appears well-developed.  HENT:  Head: Normocephalic.  Eyes: Pupils are equal, round, and reactive to light.  Neck: Normal range of motion.  Cardiovascular: Normal rate.   Respiratory: Effort normal.  GI: Soft.  Genitourinary:  No CVAT at present  Musculoskeletal: Normal range of motion.  Neurological: He is alert.  Skin: Skin is warm.  Psychiatric: He  has a normal mood and affect. His behavior is normal. Judgment and thought content normal.     Assessment/Plan   1 - Left Enhancing Renal Mass - mass quite endophytic and frankly concerned for T3 disease, though clinically localized. Rec left radical nephrectomy.  We rediscussed the role of radical nephrectomy with the overall goal of complete surgical excision (negative margins) and better staging / diagnosis. We specifically rediscussed that with removal of the kidney there would be an overall renal function decline with attendant risks of  renal failure and need for dialysis in some cases, and need for kidney-friendly lifestyle post-op with excellent blood pressure and glycemic control. We then rediscussed surgical approaches including robotic and open techniques with robotic associated with a shorter convalescence. I showed the patient on their abdomen the approximately 4-6 incision (trocar) sites as well as presumed extraction sites with robotic approach as well as possible open incision sites. We specifically readdressed that there may be need to alter operative plans according to intraopertive findings including conversion to open procedure. We rediscussed specific peri-operative risks including bleeding, infection, deep vein thrombosis, pulmonary embolism, compartment syndrome, nuropathy / neuropraxia, heart attack, stroke, death, as well as long-term risks such as non-cure / need for additional therapy and need for imaging and lab based post-op surveillance protocols. We discussed typical hospital course of approximately 2 day hospitalization, need for peri-operative drains / catheters, and typical post-hospital course with return to most non-strenuous activities by 2 weeks and ability to return to most jobs and more strenuous activity such as exercise by 6 weeks.   After this lengthy and detail discussion, including answering all of the patient's questions to their satisfaction, they have chosen to proceed with left robotic radical nephrectomy.  I feel this can be safely done at same time as lap chole with Dr. Johney Maine. I am happy for him to go first. Given his truncal obesity I frankly discssed that we may very well need separate port sites, and he is fine with that.   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 Non-functional by serum markers. Observe as <4cm.   Alexis Frock, MD 09/22/2015, 6:36 AM

## 2015-09-22 NOTE — Interval H&P Note (Signed)
History and Physical Interval Note:  09/22/2015 7:32 AM  Edwin Rogers  has presented today for surgery, with the diagnosis of LEFT RENAL MASS symptomatic biliary colic, probable chronic cholecystitis  The various methods of treatment have been discussed with the patient and family. After consideration of risks, benefits and other options for treatment, the patient has consented to  Procedure(s): XI ROBOTIC ASSISTED LAPAROSCOPIC LEFT RADIAL NEPHRECTOMY (N/A) XI ROBOTIC ASSISTED LAPAROSCOPIC CHOLECYSTECTOMY (N/A) as a surgical intervention .  The patient's history has been reviewed, patient examined, no change in status, stable for surgery.  I have reviewed the patient's chart and labs.  Questions were answered to the patient's satisfaction.     Rickie Gange C.

## 2015-09-22 NOTE — H&P (Signed)
Edwin Rogers 08/08/2015 2:25 PM Location: [facility] Patient #: [id number] DOB: [birth year] Married / Language: English / Race: White Male   History of Present Illness  Patient words: gallbladder.  The patient is a 45 year old male who presents for evaluation of gallbladder disease. Note for "Gallbladder disease": Patient sent for surgical consultation by Dr. Franchot Gallo with [facility]. Abdominal pain. Probable biliary dyskinesia. Plan partial nephrectomy. Consider combination case  Pleasant active male. Comes today with his wife. Has had intermittent episodes of abdominal pain. Primarily RIGHT back and RIGHT upper quadrant. About 9 months ago. Usually mild. Seem to be after [place] food such as spaghetti and pizza. Then had a more intense episode of swelling and discomfort. Had a bad attack of nausea vomiting and diarrhea in November. Wife felt a little sick as well. Wondered if it was a stomach bug. That happened to 2 weeks later. Addition. Dr. Riley Kill ordered an ultrasound. No gallstones but kidney mass noted. CT and MRI noted LEFT upper pole kidney mass at least 4 cm suspicious. Saw probable small LIHernia as well. Saw urology. Felt to benefit from nephrectomy given suspicious mass. Probable partial nephrectomy. Therefore referring to Dr. Phebe Colla for robotic partial resection.  In the meantime, he is still getting abdominal pains and attacks. That showed gallbladder ejection fraction was borderline normal. The production of symptoms. However patient is having trouble with attacks. He does have heartburn controlled with Pepcid at night. This does not seem like that. It is not improved by taking extra doses. He can walk a half hour without difficulty. Stays physically active. His a large overweight man but no exertional pain. Interestingly, his father had gallbladder problems or prior cholecystectomy and then developed kidney mass as  well. His father was diagnosed with colon cancer. The patient did have a colonoscopy in 2015. Many polyps removed. All but one were hyperplastic. He saw urology. They wondered if he would benefit from cholecystectomy at the time of the nephrectomy. Therefore sent for surgical consultation with [place] since we do robotic surgery.  No personal nor family history of GI/colon cancer, inflammatory bowel disease, irritable bowel syndrome, allergy such as Celiac Sprue, dietary/dairy problems, colitis, ulcers nor gastritis. No recent sick contacts/gastroenteritis. No travel outside the country. No changes in diet. No dysphagia to solids or liquids. No significant heartburn or reflux. No hematochezia, hematemesis, coffee ground emesis. No evidence of prior gastric/peptic ulceration.   Other Problems Marjean Donna, CMA; 08/08/2015 2:25 PM) Cancer Gastroesophageal Reflux Disease Sleep Apnea  Past Surgical History Marjean Donna, CMA; 08/08/2015 2:25 PM) Colon Polyp Removal - Colonoscopy Foot Surgery Right.  Diagnostic Studies History Marjean Donna, CMA; 08/08/2015 2:25 PM) Colonoscopy 1-5 years ago  Allergies Davy Pique Bynum, CMA; 08/08/2015 2:26 PM) No Known Drug Allergies03/20/2017  Medication History Davy Pique Bynum, CMA; 08/08/2015 2:26 PM) No Current Medications Medications Reconciled  Social History Marjean Donna, CMA; 08/08/2015 2:25 PM) Caffeine use Carbonated beverages, Coffee, Tea. No alcohol use No drug use Tobacco use Never smoker.  Family History Marjean Donna, [place]; 08/08/2015 2:25 PM) Cancer Father. Colon Cancer Father. Colon Polyps Father. Diabetes Mellitus Father. Hypertension Mother. Melanoma Father. Thyroid problems Brother.    Review of Systems Davy Pique Bynum CMA; 08/08/2015 2:25 PM) General Not Present- Appetite Loss, Chills, Fatigue, Fever, Night Sweats, Weight Gain and Weight Loss. Skin Present- Dryness. Not Present- Change in Wart/Mole, Hives, Jaundice,  New Lesions, Non-Healing Wounds, Rash and Ulcer. HEENT Not Present- Earache, Hearing Loss, Hoarseness, Nose Bleed, Oral Ulcers, Ringing in  the Ears, Seasonal Allergies, Sinus Pain, Sore Throat, Visual Disturbances, Wears glasses/contact lenses and Yellow Eyes. Respiratory Present- Snoring. Not Present- Bloody sputum, Chronic Cough, Difficulty Breathing and Wheezing. Breast Not Present- Breast Mass, Breast Pain, Nipple Discharge and Skin Changes. Cardiovascular Not Present- Chest Pain, Difficulty Breathing Lying Down, Leg Cramps, Palpitations, Rapid Heart Rate, Shortness of Breath and Swelling of Extremities. Gastrointestinal Present- Abdominal Pain, Bloating, Change in Bowel Habits, Hemorrhoids and Indigestion. Not Present- Bloody Stool, Chronic diarrhea, Constipation, Difficulty Swallowing, Excessive gas, Gets full quickly at meals, Nausea, Rectal Pain and Vomiting. Male Genitourinary Not Present- Blood in Urine, Change in Urinary Stream, Frequency, Impotence, Nocturia, Painful Urination, Urgency and Urine Leakage. Musculoskeletal Present- Joint Stiffness. Not Present- Back Pain, Joint Pain, Muscle Pain, Muscle Weakness and Swelling of Extremities. Neurological Not Present- Decreased Memory, Fainting, Headaches, Numbness, Seizures, Tingling, Tremor, Trouble walking and Weakness. Psychiatric Not Present- Anxiety, Bipolar, Change in Sleep Pattern, Depression, Fearful and Frequent crying. Endocrine Not Present- Cold Intolerance, Excessive Hunger, Hair Changes, Heat Intolerance, Hot flashes and New Diabetes. Hematology Not Present- Easy Bruising, Excessive bleeding, Gland problems, HIV and Persistent Infections.  Vitals (Sonya Bynum CMA; 08/08/2015 2:25 PM) 08/08/2015 2:25 PM Weight: 339 lb Height: 75in Body Surface Area: 2.75 m Body Mass Index: 42.37 kg/m  Temp.: 64F(Temporal)  Pulse: 80 (Regular)  BP: 132/80 (Sitting, Left Arm, Standard)       Physical Exam [name] MD;  08/08/2015 3:08 PM) General Mental Status-Alert. General Appearance-Not in acute distress, Not Sickly. Orientation-Oriented X3. Hydration-Well hydrated. Voice-Normal.  Integumentary Global Assessment Upon inspection and palpation of skin surfaces of the - Axillae: non-tender, no inflammation or ulceration, no drainage. and Distribution of scalp and body hair is normal. General Characteristics Temperature - normal warmth is noted.  Head and Neck Head-normocephalic, atraumatic with no lesions or palpable masses. Face Global Assessment - atraumatic, no absence of expression. Neck Global Assessment - no abnormal movements, no bruit auscultated on the right, no bruit auscultated on the left, no decreased range of motion, non-tender. Trachea-midline. Thyroid Gland Characteristics - non-tender.  Eye Eyeball - Left-Extraocular movements intact, No Nystagmus. Eyeball - Right-Extraocular movements intact, No Nystagmus. Cornea - Left-No Hazy. Cornea - Right-No Hazy. Sclera/Conjunctiva - Left-No scleral icterus, No Discharge. Sclera/Conjunctiva - Right-No scleral icterus, No Discharge. Pupil - Left-Direct reaction to light normal. Pupil - Right-Direct reaction to light normal.  ENMT Ears Pinna - Left - no drainage observed, no generalized tenderness observed. Right - no drainage observed, no generalized tenderness observed. Nose and Sinuses External Inspection of the Nose - no destructive lesion observed. Inspection of the nares - Left - quiet respiration. Right - quiet respiration. Mouth and Throat Lips - Upper Lip - no fissures observed, no pallor noted. Lower Lip - no fissures observed, no pallor noted. Nasopharynx - no discharge present. Oral Cavity/Oropharynx - Tongue - no dryness observed. Oral Mucosa - no cyanosis observed. Hypopharynx - no evidence of airway distress observed.  Chest and Lung Exam Inspection Movements - Normal and Symmetrical.  Accessory muscles - No use of accessory muscles in breathing. Palpation Palpation of the chest reveals - Non-tender. Auscultation Breath sounds - Normal and Clear.  Cardiovascular Auscultation Rhythm - Regular. Murmurs & Other Heart Sounds - Auscultation of the heart reveals - No Murmurs and No Systolic Clicks.  Abdomen Inspection Inspection of the abdomen reveals - No Visible peristalsis and No Abnormal pulsations. Umbilicus - No Bleeding, No Urine drainage. Palpation/Percussion Palpation and Percussion of the abdomen reveal - Soft, Non  Tender, No Rebound tenderness, No Rigidity (guarding) and No Cutaneous hyperesthesia. Note: Obese but soft. Mild diastases recti. No umbilical hernia.  RIGHT upper quadrant discomfort but no true Murphy sign. Rest the abdomen nontender nondistended.   Male Genitourinary Sexual Maturity Tanner 5 - Adult hair pattern and Adult penile size and shape. Note: Normal external male genitalia. Subtle impulse in the LEFT groin suspicious for small inguinal hernia on Valsalva only. No RIGHT inguinal hernia.   Peripheral Vascular Upper Extremity Inspection - Left - No Cyanotic nailbeds, Not Ischemic. Right - No Cyanotic nailbeds, Not Ischemic.  Neurologic Neurologic evaluation reveals -normal attention span and ability to concentrate, able to name objects and repeat phrases. Appropriate fund of knowledge , normal sensation and normal coordination. Mental Status Affect - not angry, not paranoid. Cranial Nerves-Normal Bilaterally. Gait-Normal.  Neuropsychiatric Mental status exam performed with findings of-able to articulate well with normal speech/language, rate, volume and coherence, thought content normal with ability to perform basic computations and apply abstract reasoning and no evidence of hallucinations, delusions, obsessions or homicidal/suicidal ideation.  Musculoskeletal Global Assessment Spine, Ribs and Pelvis - no instability,  subluxation or laxity. Right Upper Extremity - no instability, subluxation or laxity.  Lymphatic Head & Neck  General Head & Neck Lymphatics: Bilateral - Description - No Localized lymphadenopathy. Axillary  General Axillary Region: Bilateral - Description - No Localized lymphadenopathy. Femoral & Inguinal  Generalized Femoral & Inguinal Lymphatics: Left - Description - No Localized lymphadenopathy. Right - Description - No Localized lymphadenopathy.    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 [place] symptoms. To the differential diagnosis seems unlikely. I think he would benefit from cholecystectomy.  It is reasonable to plan to do in combination with his LEFT partial/total nephrectomy. I believe they are going to see Dr. Phebe Colla with [facility] to discuss options. I'm happy to coordinate the case with Clare Gandy or whomever is doing for their group. They seem interested in proceeding.  CHRONIC CHOLECYSTITIS WITHOUT CALCULUS (K81.1) Current Plans You are being scheduled for surgery - Our schedulers will call you.  You should hear from our office's scheduling department within 5 working days about the location, date, and time of surgery. We try to make accommodations for patient's preferences in scheduling surgery, but sometimes the OR schedule or the surgeon's schedule prevents [place] from making those accommodations.  If you have not heard from our office [phone]) in 5 working days, call the office and ask for your surgeon's nurse.  If you have other questions about your diagnosis, plan, or surgery, call the office and ask for your surgeon's nurse.  The anatomy & physiology of hepatobiliary & pancreatic function was discussed. The pathophysiology of gallbladder dysfunction was discussed. Natural history risks without surgery was discussed. I feel the risks of  no intervention will lead to serious problems that outweigh the operative risks; therefore, I recommended cholecystectomy to remove the pathology. I explained laparoscopic techniques with possible need for an open approach. Probable cholangiogram to evaluate the bilary tract was explained as well.  Risks such as bleeding, infection, abscess, leak, injury to other organs, need for further treatment, heart attack, death, and other risks were discussed. I noted a good likelihood this will help address the problem. Possibility that this will not correct all abdominal symptoms was explained. Goals of post-operative recovery were discussed as well. We will work to minimize complications. An educational handout further explaining the pathology  and treatment options was given as well. Questions were answered. The patient expresses understanding & wishes to proceed with surgery.  Pt Education - Pamphlet Given - Laparoscopic Gallbladder Surgery: discussed with patient and provided information. Written instructions provided Pt Education - [facility] Laparosopic Post Op HCI (Jillaine Waren) Pt Education - [facility] Good Bowel Health (Heiress Williamson) Pt Education - Laparoscopic Cholecystectomy: gallbladder LEFT INGUINAL HERNIA (K40.90) Impression: Possible subtle LEFT inguinal hernia on CT scan and physical exam. Asymptomatic. Certainly not the highest priority right now. Would not repair at the same time of the other surgeries as mesh repair would be best. Follow for now. Consider repair in the future. 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 That would be 2020. Defer to his gastroenterologist. All the other polyps were hyperplastic.  I have re-reviewed the the patient's records, history, medications, and allergies.  I have re-examined the patient.  I again discussed intraoperative plans and goals of post-operative recovery.  The patient agrees to proceed.   [name], M.D.,  F.A.C.S. Gastrointestinal and Minimally Invasive Surgery [facility] [address] [place], [place] [id number] [phone] Main / Paging

## 2015-09-22 NOTE — Transfer of Care (Signed)
Immediate Anesthesia Transfer of Care Note  Patient: Edwin Rogers  Procedure(s) Performed: Procedure(s): XI ROBOTIC ASSISTED LAPAROSCOPIC LEFT RADIAL NEPHRECTOMY WITH EXTENSIVE LAPAROSCOPIC ADHESIOLYSIS  (N/A) XI ROBOTIC ASSISTED LAPAROSCOPIC CHOLECYSTECTOMY WITH TRU CUT LIVER BIOPSY (N/A)  Patient Location: PACU  Anesthesia Type:General  Level of Consciousness:  sedated, patient cooperative and responds to stimulation  Airway & Oxygen Therapy:Patient Spontanous Breathing and Patient connected to face mask oxygen and portable pulse ox  Post-op Assessment:  Report given to PACU RN and Post -op Vital signs reviewed and stable  Post vital signs:  Reviewed and stable  Last Vitals:  Filed Vitals:   09/22/15 0516 09/22/15 0544  BP: 149/105 153/103  Pulse: 76   Temp: 36.8 C   Resp: 18     Complications: No apparent anesthesia complications

## 2015-09-22 NOTE — Interval H&P Note (Signed)
History and Physical Interval Note:  09/22/2015 7:32 AM  Edwin Rogers  has presented today for surgery, with the diagnosis of LEFT RENAL MASS symptomatic biliary colic, probable chronic cholecystitis  The various methods of treatment have been discussed with the patient and family. After consideration of risks, benefits and other options for treatment, the patient has consented to  Procedure(s): XI ROBOTIC ASSISTED LAPAROSCOPIC LEFT RADIAL NEPHRECTOMY (N/A) XI ROBOTIC ASSISTED LAPAROSCOPIC CHOLECYSTECTOMY (N/A) as a surgical intervention .  The patient's history has been reviewed, patient examined, no change in status, stable for surgery.  I have reviewed the patient's chart and labs.  Questions were answered to the patient's satisfaction.     Gerri Acre C.

## 2015-09-23 LAB — BASIC METABOLIC PANEL
Anion gap: 7 (ref 5–15)
BUN: 17 mg/dL (ref 6–20)
CALCIUM: 8.7 mg/dL — AB (ref 8.9–10.3)
CO2: 25 mmol/L (ref 22–32)
CREATININE: 1.31 mg/dL — AB (ref 0.61–1.24)
Chloride: 105 mmol/L (ref 101–111)
GFR calc non Af Amer: >60 mL/min (ref >60)
Glucose, Bld: 133 mg/dL — ABNORMAL HIGH (ref 65–99)
Potassium: 4.1 mmol/L (ref 3.5–5.1)
SODIUM: 137 mmol/L (ref 135–145)

## 2015-09-23 LAB — HEMOGLOBIN AND HEMATOCRIT, BLOOD
HCT: 36 % — ABNORMAL LOW (ref 39.0–52.0)
Hemoglobin: 11.8 g/dL — ABNORMAL LOW (ref 13.0–17.0)

## 2015-09-23 MED ORDER — SENNOSIDES-DOCUSATE SODIUM 8.6-50 MG PO TABS: 1.0000 | ORAL_TABLET | Freq: Two times a day (BID) | ORAL | Status: DC

## 2015-09-23 MED ORDER — PROMETHAZINE HCL 25 MG PO TABS: 25.0000 mg | ORAL_TABLET | ORAL | Status: DC | PRN

## 2015-09-23 MED ORDER — PROMETHAZINE HCL 25 MG PO TABS
25.0000 mg | ORAL_TABLET | ORAL | Status: DC | PRN
  Administered 2015-09-23 (×3): 25 mg via ORAL
  Filled 2015-09-23 (×3): qty 1

## 2015-09-23 MED ORDER — PROMETHAZINE HCL 25 MG/ML IJ SOLN
25.0000 mg | INTRAMUSCULAR | Status: DC | PRN
  Filled 2015-09-23: qty 1

## 2015-09-23 MED ORDER — OXYCODONE-ACETAMINOPHEN 5-325 MG PO TABS: 1.0000 | ORAL_TABLET | Freq: Four times a day (QID) | ORAL | Status: DC | PRN

## 2015-09-23 MED ORDER — OXYCODONE HCL 5 MG PO TABS
5.0000 mg | ORAL_TABLET | ORAL | Status: DC | PRN
  Administered 2015-09-23 (×3): 5 mg via ORAL
  Filled 2015-09-23 (×3): qty 1

## 2015-09-23 NOTE — Discharge Summary (Signed)
Physician Discharge Summary  Patient ID: Edwin Rogers MRN: [id number] DOB/AGE: 22-Jul-1970 45 y.o.  Admit date: 09/22/2015 Discharge date: 09/23/2015  Admission Diagnoses:  Discharge Diagnoses:  Active Problems:   Renal mass   Discharged Condition: good  Hospital Course:   1 - Left Enhancing Renal Mass - s/p left robotic radical nephrectomy 09/22/15 in combination with robotic cholecystectomy, liver biopsy, extensive adhesiolysis in combined Urol (Demetrious Rainford) and Gen Surg (Gross) procedure. Path pending. POD1 Hgb 11.8, Cr 1.3.   2 - Acalculous Cholecystitis - s/p cholecystectomy at procedure above.   By the afternoon of POD 1, the day of discharge, he is ambulatory, maintaining PO hydration, pain controlled on oral meds, voiding spontaneously, and felt to be adequate for discharge.   Consults: None  Significant Diagnostic Studies: labs: as per above  Treatments: surgery: left robotic radical nephrectomy 09/22/15 in combination with robotic cholecystectomy, liver biopsy, extensive adhesiolysis in combined Urol (Tamma Brigandi) and Gen Surg Johney Maine)  Discharge Exam: Blood pressure 121/74, pulse 100, temperature 97.6 F (36.4 C), temperature source Oral, resp. rate 17, height 6\' 3"  (1.905 m), weight 156.037 kg (344 lb), SpO2 98 %. General appearance: alert, cooperative and appears stated age Eyes: negative Nose: Nares normal. Septum midline. Mucosa normal. No drainage or sinus tenderness. Throat: lips, mucosa, and tongue normal; teeth and gums normal Neck: supple, symmetrical, trachea midline Back: symmetric, no curvature. ROM normal. No CVA tenderness. Resp: non-labored on room air Cardio: Nl rate GI: soft, non-tender; bowel sounds normal; no masses,  no organomegaly Male genitalia: normal, Mil Lt > Rt scrotal edema.  Extremities: extremities normal, atraumatic, no cyanosis or edema Pulses: 2+ and symmetric Skin: Skin color, texture, turgor normal. No rashes or lesions Neurologic: Grossly  normal Incision/Wound: Recent port sites / extraction sites c/d/i. No hernias.   Disposition: 01-Home or Self Care     Medication List    STOP taking these medications        naproxen sodium 220 MG tablet  Commonly known as:  ANAPROX      TAKE these medications        famotidine-calcium carbonate-magnesium hydroxide 10-800-165 MG chewable tablet  Commonly known as:  PEPCID COMPLETE  Chew 1 tablet by mouth daily. Heartburn     oxyCODONE-acetaminophen 5-325 MG tablet  Commonly known as:  ROXICET  Take 1-2 tablets by mouth every 6 (six) hours as needed for moderate pain or severe pain.     promethazine 25 MG tablet  Commonly known as:  PHENERGAN  Take 1 tablet (25 mg total) by mouth every 4 (four) hours as needed for nausea or vomiting.     senna-docusate 8.6-50 MG tablet  Commonly known as:  Senokot-S  Take 1 tablet by mouth 2 (two) times daily. While taking pain meds to prevent constipation           Follow-up Information    Follow up with Alexis Frock, MD On 10/06/2015.   Specialty:  Urology   Why:  at 8:15 AM for MD visit. Dr. Tresa Moore will call you with pathology results when available.    Contact information:   [place] [address] [phone]       Follow up with GROSS,STEVEN C., MD. Schedule an appointment as soon as possible for a visit in 3 weeks.   Specialty:  General Surgery   Why:  To follow up after your operation, To follow up after your hospital stay   Contact information:   [place]  [place] [address] [phone]       Follow up with Alexis Frock, MD.   Specialty:  Urology   Why:  as previously scheduled. Feel free to contact office if unsure of scheduled post-op visit time.    Contact information:   [place] [address] [phone]       Signed: Alexis Frock 09/23/2015, 4:12 PM

## 2015-09-23 NOTE — Progress Notes (Signed)
1 Day Post-Op  Subjective:  1 - Left Enhancing Renal Mass - s/p left robotic radical nephrectomy 09/22/15 in combination with robotic cholecystectomy, liver biopsy, extensive adhesiolysis in combined Urol (Aldon Hengst) and Gen Surg (Gross) procedure. Path pending. POD1 Hgb 11.8, Cr 1.3.   2 - Acalculous Cholecystitis - s/p cholecystectomy at procedure above.   Today "Edwin Rogers" is stable. He is quite sensitive to nausea from pain meds and did best with promehtazine to combat this. Ambulatory, pain controlled. No fevers.     Objective: Vital signs in last 24 hours: Temp:  [97.6 F (36.4 C)-98.7 F (37.1 C)] 98.3 F (36.8 C) (05/05 0555) Pulse Rate:  [60-83] 83 (05/05 0555) Resp:  [14-20] 16 (05/05 0555) BP: (137-183)/(81-129) 160/91 mmHg (05/05 0555) SpO2:  [95 %-100 %] 100 % (05/05 0555) Arterial Line BP: (169-187)/(83-102) 174/83 mmHg (05/04 1315) Last BM Date: 09/21/15  Intake/Output from previous day: 05/04 0701 - 05/05 0700 In: 4626.7 [P.O.:240; I.V.:4386.7] Out: 2301 [Urine:2250; Emesis/NG output:1; Blood:50] Intake/Output this shift:    General appearance: alert, cooperative, appears stated age and family at bedside Eyes: negative Nose: Nares normal. Septum midline. Mucosa normal. No drainage or sinus tenderness. Throat: lips, mucosa, and tongue normal; teeth and gums normal Neck: supple, symmetrical, trachea midline Back: symmetric, no curvature. ROM normal. No CVA tenderness. Resp: non-labored on room air Cardio: Nl rate GI: soft, non-tender; bowel sounds normal; no masses,  no organomegaly Extremities: extremities normal, atraumatic, no cyanosis or edema Pulses: 2+ and symmetric Lymph nodes: Cervical, supraclavicular, and axillary nodes normal. Neurologic: Grossly normal Incision/Wound: foley c/d/i with yellow urine. Recent port sites / extraction sites c/d/i. No hematomas / hernias.   Lab Results:   Recent Labs  09/22/15 1212 09/23/15 0508  HGB 12.9* 11.8*  HCT 40.3  36.0*   BMET  Recent Labs  09/23/15 0508  NA 137  K 4.1  CL 105  CO2 25  GLUCOSE 133*  BUN 17  CREATININE 1.31*  CALCIUM 8.7*   PT/INR No results for input(s): LABPROT, INR in the last 72 hours. ABG No results for input(s): PHART, HCO3 in the last 72 hours.  Invalid input(s): PCO2, PO2  Studies/Results: No results found.  Anti-infectives: Anti-infectives    Start     Dose/Rate Route Frequency Ordered Stop   09/22/15 0530  ceFAZolin (ANCEF) IVPB 2g/100 mL premix     2 g 200 mL/hr over 30 Minutes Intravenous 30 min pre-op 09/22/15 [id number] 09/22/15 0819      Assessment/Plan:  1 - Left Enhancing Renal Mass - doing well POD 1. SLIV, Ambulate, Reg Diet, Increase promethazine dose and frequency. Discussed goals for discharge and Halfway PM today v. Tomorrow.   2 - Acalculous Cholecystitis - path pending. Adv to reg diet today. No jaundice.    [facility], Bryona Foxworthy 09/23/2015

## 2015-09-23 NOTE — Care Management Note (Signed)
Case Management Note  Patient Details  Name: Edwin Rogers MRN: [id number] Date of Birth: 19-Aug-1970  Subjective/Objective: 45 y/o m admitted w/renal mass, cholecystitis. S/p lap rad nephrectomy, lap chole. From home.                   Action/Plan:d/c plan home.   Expected Discharge Date:                  Expected Discharge Plan:  Home/Self Care  In-House Referral:     Discharge planning Services  CM Consult  Post Acute Care Choice:    Choice offered to:     DME Arranged:    DME Agency:     HH Arranged:    HH Agency:     Status of Service:  In process, will continue to follow  Medicare Important Message Given:    Date Medicare IM Given:    Medicare IM give by:    Date Additional Medicare IM Given:    Additional Medicare Important Message give by:     If discussed at [place] of Stay Meetings, dates discussed:    Additional Comments:  Dessa Phi, RN 09/23/2015, 3:25 PM

## 2015-09-23 NOTE — Progress Notes (Signed)
Pt has been nauseated since the beginning of my shift at 1900, pt has been unable to walk since he has been on the floor. Pt agreed once nausea is under control he will walk.

## 2015-09-23 NOTE — Progress Notes (Signed)
Pt is being discharged home. Discharge instructions were given to patient. Discharge instructions included but not limited to  Sign and symptoms of infection, when to call the doctor and follow up appoitment were discussed with the patient. Patient stated understanding of the instructions

## 2016-04-17 ENCOUNTER — Other Ambulatory Visit: Payer: Self-pay | Admitting: Urology

## 2016-04-17 ENCOUNTER — Ambulatory Visit (HOSPITAL_COMMUNITY)
Admission: RE | Admit: 2016-04-17 | Discharge: 2016-04-17 | Disposition: A | Discharge status: Home / Self Care | Payer: PRIVATE HEALTH INSURANCE | Source: Ambulatory Visit | Attending: Urology | Admitting: Urology

## 2016-04-17 DIAGNOSIS — C642 Malignant neoplasm of left kidney, except renal pelvis: Secondary | ICD-10-CM

## 2016-04-17 DIAGNOSIS — R918 Other nonspecific abnormal finding of lung field: Secondary | ICD-10-CM | POA: Diagnosis not present

## 2016-07-26 ENCOUNTER — Telehealth (INDEPENDENT_AMBULATORY_CARE_PROVIDER_SITE_OTHER): Payer: Self-pay | Admitting: *Deleted

## 2016-07-26 ENCOUNTER — Encounter (INDEPENDENT_AMBULATORY_CARE_PROVIDER_SITE_OTHER): Payer: Self-pay | Admitting: Internal Medicine

## 2016-07-26 ENCOUNTER — Encounter (INDEPENDENT_AMBULATORY_CARE_PROVIDER_SITE_OTHER): Payer: Self-pay | Admitting: *Deleted

## 2016-07-26 ENCOUNTER — Other Ambulatory Visit (INDEPENDENT_AMBULATORY_CARE_PROVIDER_SITE_OTHER): Payer: Self-pay | Admitting: Internal Medicine

## 2016-07-26 ENCOUNTER — Ambulatory Visit (INDEPENDENT_AMBULATORY_CARE_PROVIDER_SITE_OTHER): Payer: PRIVATE HEALTH INSURANCE | Admitting: Internal Medicine

## 2016-07-26 VITALS — BP 144/80 | HR 60 | Temp 97.6°F | Ht 75.0 in | Wt 334.9 lb

## 2016-07-26 DIAGNOSIS — K625 Hemorrhage of anus and rectum: Secondary | ICD-10-CM | POA: Diagnosis not present

## 2016-07-26 DIAGNOSIS — Z800 Family history of malignant neoplasm of digestive organs: Secondary | ICD-10-CM

## 2016-07-26 MED ORDER — PEG 3350-KCL-NA BICARB-NACL 420 G PO SOLR: 4000.0000 mL | Freq: Once | ORAL | 0 refills | Status: AC

## 2016-07-26 NOTE — Progress Notes (Signed)
Subjective:    Patient ID: Edwin Rogers, male    DOB: 23-Jun-1970, 46 y.o.   MRN: [id number]  HPI Referred by Dr. Gerarda Fraction for rectal bleeding. He states he has been having rectal bleeding.  He tested his stool at home and was positive.  He sometimes has pain in his rt lower quadrant and his back.  He notices blood  in the commode and sometimes it will drip. Sometimes it is on the surface of the stool.  He says he has rectal bleeding every times he has a BM . Stools are not hard. He says he feels like he has incomplete emptying when he has a BM.  His appetite is good. He has weight loss of about 20 pounds which was intentional.     He had the flu about 2 months ago and had diarrhea.    09/22/2015 Underwent a XI Robotic assisted laparoscopic cholecystectomy for symptomatic biliary colic, probable chronic cholecystitis, probable steatohepatitis ( Dr. Michael Boston) Biopsy: Steatohepatitis. Stage 1 Left radial nephrectomy with extensive laparoscopic adhesiolysis. For left renal mass.  Dr. Alexis Frock.   .  05/07/2016 H and H 12.5 and 36.8, MCV 84, Ferritin 262, Retic 1.9%, TIBC 254, Iron 79, % Sat 31.  05/27/2013 Colonoscopy  Indications: Patient is 46 year old Caucasian male who is undergoing screening colonoscopy. Family history is significant for colon carcinoma in first and second-degree relatives.  Indications: Patient is 46 year old Caucasian male who is undergoing screening colonoscopy. Family history is significant for colon carcinoma in first and second-degree relatives. (father, grandmother).  Impression:  Examination performed to cecum. Three small polyps removed using cold snared and biopsy. These polyps are submitted together(one at splenic flexure and two at sigmoid colon). Four small polyps from rectum were submitted together; two were removed with biopsy forceps and two were cold snared.  Small rectal polyps are hyperplastic. Off the 3 other small polyps one  is sessile serrated adenoma and others hyperplastic. Patient called and message/resolved left on his answering service. Next colonoscopy in 5 years  Review of Systems Past Medical History:  Diagnosis Date  . GERD (gastroesophageal reflux disease)   . Hypertension    pt states currently on no medications  . Left groin hernia   . Sleep apnea     Past Surgical History:  Procedure Laterality Date  . ANKLE FRACTURE SURGERY     last year with a plate  . COLONOSCOPY N/A 05/27/2013   Procedure: COLONOSCOPY;  Surgeon: Rogene Houston, MD;  Location: [facility];  Service: Endoscopy;  Laterality: N/A;  100  . ORIF ANKLE FRACTURE  05/10/2012   Procedure: OPEN REDUCTION INTERNAL FIXATION (ORIF) ANKLE FRACTURE;  Surgeon: Marin Shutter, MD;  Location: [place];  Service: Orthopedics;  Laterality: Right;  . ROBOT ASSISTED LAPAROSCOPIC NEPHRECTOMY N/A 09/22/2015   Procedure: XI ROBOTIC ASSISTED LAPAROSCOPIC LEFT RADIAL NEPHRECTOMY WITH EXTENSIVE LAPAROSCOPIC ADHESIOLYSIS ;  Surgeon: Alexis Frock, MD;  Location: [facility];  Service: Urology;  Laterality: N/A;    Allergies  Allergen Reactions  . Other Nausea And Vomiting    ALL PAIN MEDS    Current Outpatient Prescriptions on File Prior to Visit  Medication Sig Dispense Refill  . famotidine-calcium carbonate-magnesium hydroxide (PEPCID COMPLETE) [phone] MG CHEW chewable tablet Chew 1 tablet by mouth daily. Heartburn     No current facility-administered medications on file prior to visit.        Objective:   Physical Exam Blood pressure (!) 144/80, pulse 60, temperature 97.6 F (  36.4 C), height 6\' 3"  (1.905 m), weight (!) 334 lb 14.4 oz (151.9 kg).  Alert and oriented. Skin warm and dry. Oral mucosa is moist.   . Sclera anicteric, conjunctivae is pink. Thyroid not enlarged. No cervical lymphadenopathy. Lungs clear. Heart regular rate and rhythm.  Abdomen is soft. Bowel sounds are positive. No hepatomegaly. No abdominal masses felt. No  tenderness.  No edema to lower extremities.        Assessment & Plan:  Rectal bleeding. Family hx of colon cancer. Colonoscopy. The risks and benefits such as perforation, bleeding, and infection were reviewed with the patient and is agreeable.

## 2016-07-26 NOTE — Telephone Encounter (Signed)
Patient needs trilyte 

## 2016-07-26 NOTE — Patient Instructions (Signed)
Colonoscopy.  The risks and benefits such as perforation, bleeding, and infection were reviewed with the patient and is agreeable. 

## 2016-07-27 ENCOUNTER — Encounter (INDEPENDENT_AMBULATORY_CARE_PROVIDER_SITE_OTHER): Payer: Self-pay

## 2016-09-26 ENCOUNTER — Encounter (HOSPITAL_COMMUNITY): Payer: Self-pay | Admitting: *Deleted

## 2016-09-26 ENCOUNTER — Encounter (HOSPITAL_COMMUNITY)
Admission: RE | Disposition: A | Discharge status: Home / Self Care | Payer: Self-pay | Source: Ambulatory Visit | Attending: Internal Medicine

## 2016-09-26 ENCOUNTER — Ambulatory Visit (HOSPITAL_COMMUNITY)
Admission: RE | Admit: 2016-09-26 | Discharge: 2016-09-26 | Disposition: A | Discharge status: Home / Self Care | Payer: PRIVATE HEALTH INSURANCE | Source: Ambulatory Visit | Attending: Internal Medicine | Admitting: Internal Medicine

## 2016-09-26 DIAGNOSIS — K921 Melena: Secondary | ICD-10-CM | POA: Insufficient documentation

## 2016-09-26 DIAGNOSIS — K5900 Constipation, unspecified: Secondary | ICD-10-CM | POA: Diagnosis not present

## 2016-09-26 DIAGNOSIS — D122 Benign neoplasm of ascending colon: Secondary | ICD-10-CM | POA: Insufficient documentation

## 2016-09-26 DIAGNOSIS — I10 Essential (primary) hypertension: Secondary | ICD-10-CM | POA: Insufficient documentation

## 2016-09-26 DIAGNOSIS — Z86010 Personal history of colonic polyps: Secondary | ICD-10-CM | POA: Insufficient documentation

## 2016-09-26 DIAGNOSIS — K219 Gastro-esophageal reflux disease without esophagitis: Secondary | ICD-10-CM | POA: Insufficient documentation

## 2016-09-26 DIAGNOSIS — K644 Residual hemorrhoidal skin tags: Secondary | ICD-10-CM | POA: Insufficient documentation

## 2016-09-26 DIAGNOSIS — Z905 Acquired absence of kidney: Secondary | ICD-10-CM | POA: Diagnosis not present

## 2016-09-26 DIAGNOSIS — Z800 Family history of malignant neoplasm of digestive organs: Secondary | ICD-10-CM | POA: Insufficient documentation

## 2016-09-26 DIAGNOSIS — G4730 Sleep apnea, unspecified: Secondary | ICD-10-CM | POA: Insufficient documentation

## 2016-09-26 DIAGNOSIS — K625 Hemorrhage of anus and rectum: Secondary | ICD-10-CM | POA: Insufficient documentation

## 2016-09-26 HISTORY — PX: COLONOSCOPY: SHX5424

## 2016-09-26 HISTORY — PX: POLYPECTOMY: SHX5525

## 2016-09-26 SURGERY — COLONOSCOPY
Anesthesia: Moderate Sedation

## 2016-09-26 MED ORDER — SODIUM CHLORIDE 0.9 % IV SOLN
INTRAVENOUS | Status: DC
  Administered 2016-09-26: 1000 mL via INTRAVENOUS

## 2016-09-26 MED ORDER — MEPERIDINE HCL 50 MG/ML IJ SOLN
INTRAMUSCULAR | Status: DC | PRN
  Administered 2016-09-26 (×2): 25 mg via INTRAVENOUS

## 2016-09-26 MED ORDER — MEPERIDINE HCL 50 MG/ML IJ SOLN
INTRAMUSCULAR | Status: AC
  Filled 2016-09-26: qty 1

## 2016-09-26 MED ORDER — MIDAZOLAM HCL 5 MG/5ML IJ SOLN
INTRAMUSCULAR | Status: AC
  Filled 2016-09-26: qty 10

## 2016-09-26 MED ORDER — DOCUSATE SODIUM 100 MG PO CAPS: 200.0000 mg | ORAL_CAPSULE | Freq: Every day | ORAL | 0 refills | Status: DC

## 2016-09-26 MED ORDER — MIDAZOLAM HCL 5 MG/5ML IJ SOLN
INTRAMUSCULAR | Status: DC | PRN
  Administered 2016-09-26: 1 mg via INTRAVENOUS
  Administered 2016-09-26 (×2): 2 mg via INTRAVENOUS
  Administered 2016-09-26: 3 mg via INTRAVENOUS
  Administered 2016-09-26: 2 mg via INTRAVENOUS

## 2016-09-26 MED ORDER — BENEFIBER DRINK MIX PO PACK: 4.0000 g | PACK | Freq: Every day | ORAL | Status: DC

## 2016-09-26 MED ORDER — STERILE WATER FOR IRRIGATION IR SOLN
Status: DC | PRN
  Administered 2016-09-26: 13:00:00

## 2016-09-26 NOTE — Discharge Instructions (Signed)
No Aspirin and NSAIDs for one week. Resume usual medications and diet. Colace stone milligrams by mouth daily at bedtime. Benefiber 4 g by mouth daily at bedtime. No driving for 24 hours. Physician will call with biopsy results.   Colonoscopy, Adult, Care After This sheet gives you information about how to care for yourself after your procedure. Your doctor may also give you more specific instructions. If you have problems or questions, call your doctor. Follow these instructions at home: General instructions    For the first 24 hours after the procedure:  Do not drive or use machinery.  Do not sign important documents.  Do not drink alcohol.  Do your daily activities more slowly than normal.  Eat foods that are soft and easy to digest.  Rest often.  Take over-the-counter or prescription medicines only as told by your doctor.  It is up to you to get the results of your procedure. Ask your doctor, or the department performing the procedure, when your results will be ready. To help cramping and bloating:   Try walking around.  Put heat on your belly (abdomen) as told by your doctor. Use a heat source that your doctor recommends, such as a moist heat pack or a heating pad.  Put a towel between your skin and the heat source.  Leave the heat on for 20-30 minutes.  Remove the heat if your skin turns bright red. This is especially important if you cannot feel pain, heat, or cold. You can get burned. Eating and drinking   Drink enough fluid to keep your pee (urine) clear or pale yellow.  Return to your normal diet as told by your doctor. Avoid heavy or fried foods that are hard to digest.  Avoid drinking alcohol for as long as told by your doctor. Contact a doctor if:  You have blood in your poop (stool) 2-3 days after the procedure. Get help right away if:  You have more than a small amount of blood in your poop.  You see large clumps of tissue (blood clots) in your  poop.  Your belly is swollen.  You feel sick to your stomach (nauseous).  You throw up (vomit).  You have a fever.  You have belly pain that gets worse, and medicine does not help your pain. This information is not intended to replace advice given to you by your health care provider. Make sure you discuss any questions you have with your health care provider. Document Released: 06/09/2010 Document Revised: 01/30/2016 Document Reviewed: 01/30/2016 Elsevier Interactive Patient Education  2017 [employer]  Hemorrhoids Hemorrhoids are swollen veins in and around the rectum or anus. There are two types of hemorrhoids:  Internal hemorrhoids. These occur in the veins that are just inside the rectum. They may poke through to the outside and become irritated and painful.  External hemorrhoids. These occur in the veins that are outside of the anus and can be felt as a painful swelling or hard lump near the anus. Most hemorrhoids do not cause serious problems, and they can be managed with home treatments such as diet and lifestyle changes. If home treatments do not help your symptoms, procedures can be done to shrink or remove the hemorrhoids. What are the causes? This condition is caused by increased pressure in the anal area. This pressure may result from various things, including:  Constipation.  Straining to have a bowel movement.  Diarrhea.  Pregnancy.  Obesity.  Sitting for long periods of time.  Heavy lifting or other activity that causes you to strain.  Anal sex. What are the signs or symptoms? Symptoms of this condition include:  Pain.  Anal itching or irritation.  Rectal bleeding.  Leakage of stool (feces).  Anal swelling.  One or more lumps around the anus. How is this diagnosed? This condition can often be diagnosed through a visual exam. Other exams or tests may also be done, such as:  Examination of the rectal area with a gloved hand (digital rectal  exam).  Examination of the anal canal using a small tube (anoscope).  A blood test, if you have lost a significant amount of blood.  A test to look inside the colon (sigmoidoscopy or colonoscopy). How is this treated? This condition can usually be treated at home. However, various procedures may be done if dietary changes, lifestyle changes, and other home treatments do not help your symptoms. These procedures can help make the hemorrhoids smaller or remove them completely. Some of these procedures involve surgery, and others do not. Common procedures include:  Rubber band ligation. Rubber bands are placed at the base of the hemorrhoids to cut off the blood supply to them.  Sclerotherapy. Medicine is injected into the hemorrhoids to shrink them.  Infrared coagulation. A type of light energy is used to get rid of the hemorrhoids.  Hemorrhoidectomy surgery. The hemorrhoids are surgically removed, and the veins that supply them are tied off.  Stapled hemorrhoidopexy surgery. A circular stapling device is used to remove the hemorrhoids and use staples to cut off the blood supply to them. Follow these instructions at home: Eating and drinking   Eat foods that have a lot of fiber in them, such as whole grains, beans, nuts, fruits, and vegetables. Ask your health care provider about taking products that have added fiber (fiber supplements).  Drink enough fluid to keep your urine clear or pale yellow. Managing pain and swelling   Take warm sitz baths for 20 minutes, 3-4 times a day to ease pain and discomfort.  If directed, apply ice to the affected area. Using ice packs between sitz baths may be helpful.  Put ice in a plastic bag.  Place a towel between your skin and the bag.  Leave the ice on for 20 minutes, 2-3 times a day. General instructions   Take over-the-counter and prescription medicines only as told by your health care provider.  Use medicated creams or suppositories as  told.  Exercise regularly.  Go to the bathroom when you have the urge to have a bowel movement. Do not wait.  Avoid straining to have bowel movements.  Keep the anal area dry and clean. Use wet toilet paper or moist towelettes after a bowel movement.  Do not sit on the toilet for long periods of time. This increases blood pooling and pain. Contact a health care provider if:  You have increasing pain and swelling that are not controlled by treatment or medicine.  You have uncontrolled bleeding.  You have difficulty having a bowel movement, or you are unable to have a bowel movement.  You have pain or inflammation outside the area of the hemorrhoids. This information is not intended to replace advice given to you by your health care provider. Make sure you discuss any questions you have with your health care provider. Document Released: 05/04/2000 Document Revised: 10/05/2015 Document Reviewed: 01/19/2015 Elsevier Interactive Patient Education  2017 [employer] [place].  Colon Polyps Polyps are tissue growths inside the body. Polyps can grow  in many places, including the large intestine (colon). A polyp may be a round bump or a mushroom-shaped growth. You could have one polyp or several. Most colon polyps are noncancerous (benign). However, some colon polyps can become cancerous over time. What are the causes? The exact cause of colon polyps is not known. What increases the risk? This condition is more likely to develop in people who:  Have a family history of colon cancer or colon polyps.  Are older than 60 or older than 45 if they are African American.  Have inflammatory bowel disease, such as ulcerative colitis or Crohn disease.  Are overweight.  Smoke cigarettes.  Do not get enough exercise.  Drink too much alcohol.  Eat a diet that is:  High in fat and red meat.  Low in fiber.  Had childhood cancer that was treated with abdominal radiation. What are the signs or  symptoms? Most polyps do not cause symptoms. If you have symptoms, they may include:  Blood coming from your rectum when having a bowel movement.  Blood in your stool.The stool may look dark red or black.  A change in bowel habits, such as constipation or diarrhea. How is this diagnosed? This condition is diagnosed with a colonoscopy. This is a procedure that uses a lighted, flexible scope to look at the inside of your colon. How is this treated? Treatment for this condition involves removing any polyps that are found. Those polyps will then be tested for cancer. If cancer is found, your health care provider will talk to you about options for colon cancer treatment. Follow these instructions at home: Diet   Eat plenty of fiber, such as fruits, vegetables, and whole grains.  Eat foods that are high in calcium and vitamin D, such as milk, cheese, yogurt, eggs, liver, fish, and broccoli.  Limit foods high in fat, red meats, and processed meats, such as hot dogs, sausage, bacon, and lunch meats.  Maintain a healthy weight, or lose weight if recommended by your health care provider. General instructions   Do not smoke cigarettes.  Do not drink alcohol excessively.  Keep all follow-up visits as told by your health care provider. This is important. This includes keeping regularly scheduled colonoscopies. Talk to your health care provider about when you need a colonoscopy.  Exercise every day or as told by your health care provider. Contact a health care provider if:  You have new or worsening bleeding during a bowel movement.  You have new or increased blood in your stool.  You have a change in bowel habits.  You unexpectedly lose weight. This information is not intended to replace advice given to you by your health care provider. Make sure you discuss any questions you have with your health care provider. Document Released: 02/01/2004 Document Revised: 10/13/2015 Document  Reviewed: 03/28/2015 Elsevier Interactive Patient Education  2017 [employer].

## 2016-09-26 NOTE — H&P (Signed)
Edwin Rogers is an 46 y.o. male.   Chief Complaint: Patient is here for colonoscopy. HPI: Patient is 46 year old Caucasian male with history of colonic polyps with last colonoscopy in January 2015 who presents with frequent rectal bleeding which started about 4 months ago. He passes small amount of bright blood bowel movements. These could constipation. At times blood-tinged. He also has noted right-sided abdominal pain occurs with physical activity or [name]'s driving his car and experiences bump. Family history significant for CRC in father with surgery at age 55 is 26. He then developed leukemia but died of fulminant C. difficile colitis 94. Paternal grandmother also had colon cancer around age 51.  Past Medical History:  Diagnosis Date  . GERD (gastroesophageal reflux disease)   . Hypertension    pt states currently on no medications  . Left groin hernia   . Sleep apnea     Past Surgical History:  Procedure Laterality Date  . ANKLE FRACTURE SURGERY     last year with a plate  . CHOLECYSTECTOMY    . COLONOSCOPY N/A 05/27/2013   Procedure: COLONOSCOPY;  Surgeon: Rogene Houston, MD;  Location: [facility];  Service: Endoscopy;  Laterality: N/A;  100  . ORIF ANKLE FRACTURE  05/10/2012   Procedure: OPEN REDUCTION INTERNAL FIXATION (ORIF) ANKLE FRACTURE;  Surgeon: Marin Shutter, MD;  Location: [place];  Service: Orthopedics;  Laterality: Right;  . ROBOT ASSISTED LAPAROSCOPIC NEPHRECTOMY N/A 09/22/2015   Procedure: XI ROBOTIC ASSISTED LAPAROSCOPIC LEFT RADIAL NEPHRECTOMY WITH EXTENSIVE LAPAROSCOPIC ADHESIOLYSIS ;  Surgeon: Alexis Frock, MD;  Location: [facility];  Service: Urology;  Laterality: N/A;    Family History  Problem Relation Age of Onset  . Colon cancer Father   . Colon cancer Other   . Colon cancer Other    Social History:  reports that he has never smoked. He has never used smokeless tobacco. He reports that he does not drink alcohol or use drugs.  Allergies:   Allergies  Allergen Reactions  . Other Nausea And Vomiting    ALL PAIN MEDS    Medications Prior to Admission  Medication Sig Dispense Refill  . famotidine-calcium carbonate-magnesium hydroxide (PEPCID COMPLETE) 10-800-165 MG CHEW chewable tablet Chew 1 tablet by mouth at bedtime. Heartburn       No results found for this or any previous visit (from the past 48 hour(s)). No results found.  ROS  Blood pressure 124/88, pulse (!) 58, temperature 98.2 F (36.8 C), temperature source Oral, resp. rate 13, height 6\' 3"  (1.905 m), weight (!) 340 lb (154.2 kg), SpO2 100 %. Physical Exam  Constitutional: He appears well-developed and well-nourished.  HENT:  Mouth/Throat: Oropharynx is clear and moist.  Eyes: Conjunctivae are normal. No scleral icterus.  Neck: No thyromegaly present.  Cardiovascular: Normal rate, regular rhythm and normal heart sounds.   No murmur heard. Respiratory: Effort normal and breath sounds normal.  GI:  Abdomen is full but soft and nontender without organomegaly or masses.  Musculoskeletal: He exhibits no edema.  Lymphadenopathy:    He has no cervical adenopathy.  Neurological: He is alert.  Skin: Skin is warm and dry.     Assessment/Plan Hematochezia. History of colonic polyps and family history of CRC in first and second-degree relatives. Diagnostic colonoscopy.  Hildred Laser, MD 09/26/2016, 12:49 PM

## 2016-09-26 NOTE — Op Note (Signed)
[facility] Patient Name: Edwin Rogers Procedure Date: 09/26/2016 12:21 PM MRN: [id number] Date of Birth: 1971-01-24 Attending MD: Hildred Laser , MD CSN: [id number] Age: 46 Admit Type: Outpatient Procedure:                Colonoscopy Indications:              Hematochezia, Family history of colon cancer in a                            first-degree relative Providers:                Hildred Laser, MD, Otis Peak B. Sharon Seller, RN, Aram Candela Referring MD:             Redmond School, MD Medicines:                Meperidine 50 mg IV, Midazolam 10 mg IV Complications:            No immediate complications. Estimated Blood Loss:     Estimated blood loss was minimal. Procedure:                Pre-Anesthesia Assessment:                           - Prior to the procedure, a History and Physical                            was performed, and patient medications and                            allergies were reviewed. The patient's tolerance of                            previous anesthesia was also reviewed. The risks                            and benefits of the procedure and the sedation                            options and risks were discussed with the patient.                            All questions were answered, and informed consent                            was obtained. Prior Anticoagulants: The patient has                            taken no previous anticoagulant or antiplatelet                            agents. ASA Grade Assessment: II - A patient with  mild systemic disease. After reviewing the risks                            and benefits, the patient was deemed in                            satisfactory condition to undergo the procedure.                           After obtaining informed consent, the colonoscope                            was passed under direct vision. Throughout the   procedure, the patient's blood pressure, pulse, and                            oxygen saturations were monitored continuously. The                            [id number] ([id number]) was introduced through the                            anus and advanced to the the cecum, identified by                            appendiceal orifice and ileocecal valve. The                            colonoscopy was technically difficult and complex                            due to a tortuous colon. Successful completion of                            the procedure was aided by using manual pressure                            and withdrawing and reinserting the scope. The                            patient tolerated the procedure well. The quality                            of the bowel preparation was good. The ileocecal                            valve, appendiceal orifice, and rectum were                            photographed. Scope In: 1:00:06 PM Scope Out: 1:55:42 PM Scope Withdrawal Time: 0 hours 31 minutes 36 seconds  Total Procedure Duration: 0 hours 55 minutes 36 seconds  Findings:      The perianal and digital rectal examinations were normal.      A 6  to 12 mm polyp was found in the proximal ascending colon. The polyp       was flat. The polyp was removed with a piecemeal technique using a cold       snare. Resection and retrieval were complete. To prevent bleeding after       the polypectomy, three hemostatic clips were successfully placed (MR       conditional). There was no bleeding at the end of the procedure. The       pathology specimen was placed into Bottle Number 2.      A 6 mm polyp was found in the mid ascending colon. The polyp was       sessile. The polyp was removed with a cold snare. Resection and       retrieval were complete. The pathology specimen was placed into Bottle       Number 1.      The exam was otherwise without abnormality.      External hemorrhoids were found during  retroflexion. The hemorrhoids       were medium-sized. Impression:               - One 6 to 12 mm polyp in the proximal ascending                            colon, removed piecemeal using a cold snare.                            Resected and retrieved. Clips (MR conditional) were                            placed.                           - One 6 mm polyp in the mid ascending colon,                            removed with a cold snare. Resected and retrieved.                           - The examination was otherwise normal.                           - External hemorrhoids. Moderate Sedation:      Moderate (conscious) sedation was administered by the endoscopy nurse       and supervised by the endoscopist. The following parameters were       monitored: oxygen saturation, heart rate, blood pressure, CO2       capnography and response to care. Total physician intraservice time was       60 minutes. Recommendation:           - Patient has a contact number available for                            emergencies. The signs and symptoms of potential                            delayed complications were discussed with the  patient. Return to normal activities tomorrow.                            Written discharge instructions were provided to the                            patient.                           - Resume previous diet today.                           - Continue present medications.                           - No aspirin, ibuprofen, naproxen, or other                            non-steroidal anti-inflammatory drugs for 7 days                            after polyp removal.                           - Await pathology results.                           - Repeat colonoscopy in 3 years for surveillance. Procedure Code(s):        --- Professional ---                           [phone], Colonoscopy, flexible; with removal of                            tumor(s),  polyp(s), or other lesion(s) by snare                            technique                           99152, Moderate sedation services provided by the                            same physician or other qualified health care                            professional performing the diagnostic or                            therapeutic service that the sedation supports,                            requiring the presence of an independent trained                            observer to assist in the monitoring of the  patient's level of consciousness and physiological                            status; initial 15 minutes of intraservice time,                            patient age 18 years or older                           [phone], Moderate sedation services; each additional                            15 minutes intraservice time                           99153, Moderate sedation services; each additional                            15 minutes intraservice time                           99153, Moderate sedation services; each additional                            15 minutes intraservice time Diagnosis Code(s):        --- Professional ---                           D12.2, Benign neoplasm of ascending colon                           K64.4, Residual hemorrhoidal skin tags                           K92.1, Melena (includes Hematochezia)                           Z80.0, Family history of malignant neoplasm of                            digestive organs CPT copyright 2016 American Medical Association. All rights reserved. The codes documented in this report are preliminary and upon coder review may  be revised to meet current compliance requirements. Hildred Laser, MD Hildred Laser, MD 09/26/2016 2:10:36 PM This report has been signed electronically. Number of Addenda: 0

## 2016-10-04 ENCOUNTER — Encounter (HOSPITAL_COMMUNITY): Payer: Self-pay | Admitting: Internal Medicine

## 2016-11-02 ENCOUNTER — Ambulatory Visit (HOSPITAL_COMMUNITY)
Admission: RE | Admit: 2016-11-02 | Discharge: 2016-11-02 | Disposition: A | Discharge status: Home / Self Care | Payer: PRIVATE HEALTH INSURANCE | Source: Ambulatory Visit | Attending: Urology | Admitting: Urology

## 2016-11-02 ENCOUNTER — Other Ambulatory Visit: Payer: Self-pay | Admitting: Urology

## 2016-11-02 DIAGNOSIS — C642 Malignant neoplasm of left kidney, except renal pelvis: Secondary | ICD-10-CM | POA: Insufficient documentation

## 2017-05-27 ENCOUNTER — Other Ambulatory Visit: Payer: Self-pay | Admitting: Urology

## 2017-05-27 ENCOUNTER — Ambulatory Visit (HOSPITAL_COMMUNITY)
Admission: RE | Admit: 2017-05-27 | Discharge: 2017-05-27 | Disposition: A | Discharge status: Home / Self Care | Payer: PRIVATE HEALTH INSURANCE | Source: Ambulatory Visit | Attending: Urology | Admitting: Urology

## 2017-05-27 DIAGNOSIS — C642 Malignant neoplasm of left kidney, except renal pelvis: Secondary | ICD-10-CM

## 2017-05-29 ENCOUNTER — Encounter (INDEPENDENT_AMBULATORY_CARE_PROVIDER_SITE_OTHER): Payer: Self-pay | Admitting: Internal Medicine

## 2017-05-29 ENCOUNTER — Ambulatory Visit (INDEPENDENT_AMBULATORY_CARE_PROVIDER_SITE_OTHER): Payer: PRIVATE HEALTH INSURANCE | Admitting: Internal Medicine

## 2017-05-29 VITALS — BP 112/80 | HR 72 | Temp 98.1°F | Ht 73.0 in | Wt 328.1 lb

## 2017-05-29 DIAGNOSIS — K219 Gastro-esophageal reflux disease without esophagitis: Secondary | ICD-10-CM

## 2017-05-29 MED ORDER — DICYCLOMINE HCL 20 MG PO TABS: 20.0000 mg | ORAL_TABLET | Freq: Four times a day (QID) | ORAL | 2 refills | Status: DC

## 2017-05-29 MED ORDER — PANTOPRAZOLE SODIUM 40 MG PO TBEC: 40.0000 mg | DELAYED_RELEASE_TABLET | Freq: Every day | ORAL | 3 refills | Status: DC

## 2017-05-29 NOTE — Progress Notes (Addendum)
   Subjective:    Patient ID: Edwin Rogers, male    DOB: 11-23-1970, 47 y.o.   MRN: [id number]  HPI Here today with c/o diarrhea.   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  Has follow with Urologist this coming Monday. Today he does not have a GB. GB removed with his radical nephrectomy. States in October and in November he had 24 hours of violent diarrhea and nausea.  He states it starts with sulfa burps. Once the burping is over, symptoms will resolved.   Underwent a colonoscopy in May of last year for family hx of colon cancer, hematochezia.   Impression:               - One 6 to 12 mm polyp in the proximal ascending                            colon, removed piecemeal using a cold snare.                            Resected and retrieved. Clips (MR conditional) were                            placed.                           - One 6 mm polyp in the mid ascending colon,                            removed with a cold snare. Resected and retrieved.                           - The examination was otherwise normal.                           - External hemorrhoids. Biopsy: sessile serrated adenoma and tubular adenoma.     Review of Systems     Objective:   Physical Exam Blood pressure 112/80, pulse 72, temperature 98.1 F (36.7 C), height 6\' 1"  (1.854 m), weight (!) 328 lb 1.6 oz (148.8 kg). Alert and oriented. Skin warm and dry. Oral mucosa is moist.   . Sclera anicteric, conjunctivae is pink. Thyroid not enlarged. No cervical lymphadenopathy. Lungs clear. Heart regular rate and rhythm.  Abdomen is soft. Bowel sounds are positive. No hepatomegaly. No abdominal masses felt. No tenderness.  No edema to lower extremities.           Assessment & Plan:  N,V, Diarrhea sporadic. Am going to start him on Protonix and Dicyclomine. If pain reoccurs, he will call or come by office and I will order an hepatic function to be sure this is not of sphincter of Oddi  dysfunction.  OV in 6 months

## 2017-11-26 ENCOUNTER — Encounter (INDEPENDENT_AMBULATORY_CARE_PROVIDER_SITE_OTHER): Payer: Self-pay | Admitting: Internal Medicine

## 2017-11-26 ENCOUNTER — Ambulatory Visit (INDEPENDENT_AMBULATORY_CARE_PROVIDER_SITE_OTHER): Payer: PRIVATE HEALTH INSURANCE | Admitting: Internal Medicine

## 2017-11-29 ENCOUNTER — Other Ambulatory Visit: Payer: Self-pay | Admitting: Urology

## 2017-11-29 ENCOUNTER — Ambulatory Visit (HOSPITAL_COMMUNITY)
Admission: RE | Admit: 2017-11-29 | Discharge: 2017-11-29 | Disposition: A | Discharge status: Home / Self Care | Payer: PRIVATE HEALTH INSURANCE | Source: Ambulatory Visit | Attending: Urology | Admitting: Urology

## 2017-11-29 DIAGNOSIS — C642 Malignant neoplasm of left kidney, except renal pelvis: Secondary | ICD-10-CM

## 2017-11-29 DIAGNOSIS — J9811 Atelectasis: Secondary | ICD-10-CM | POA: Insufficient documentation

## 2018-03-11 IMAGING — CR DG CHEST 2V
2 series · 2 of 2 positions shown · non-contrast
Comparison: 01/05/2015.

CLINICAL DATA: Recent kidney removal for renal cell cancer .

EXAM:
CHEST  2 VIEW

[w chest pa]
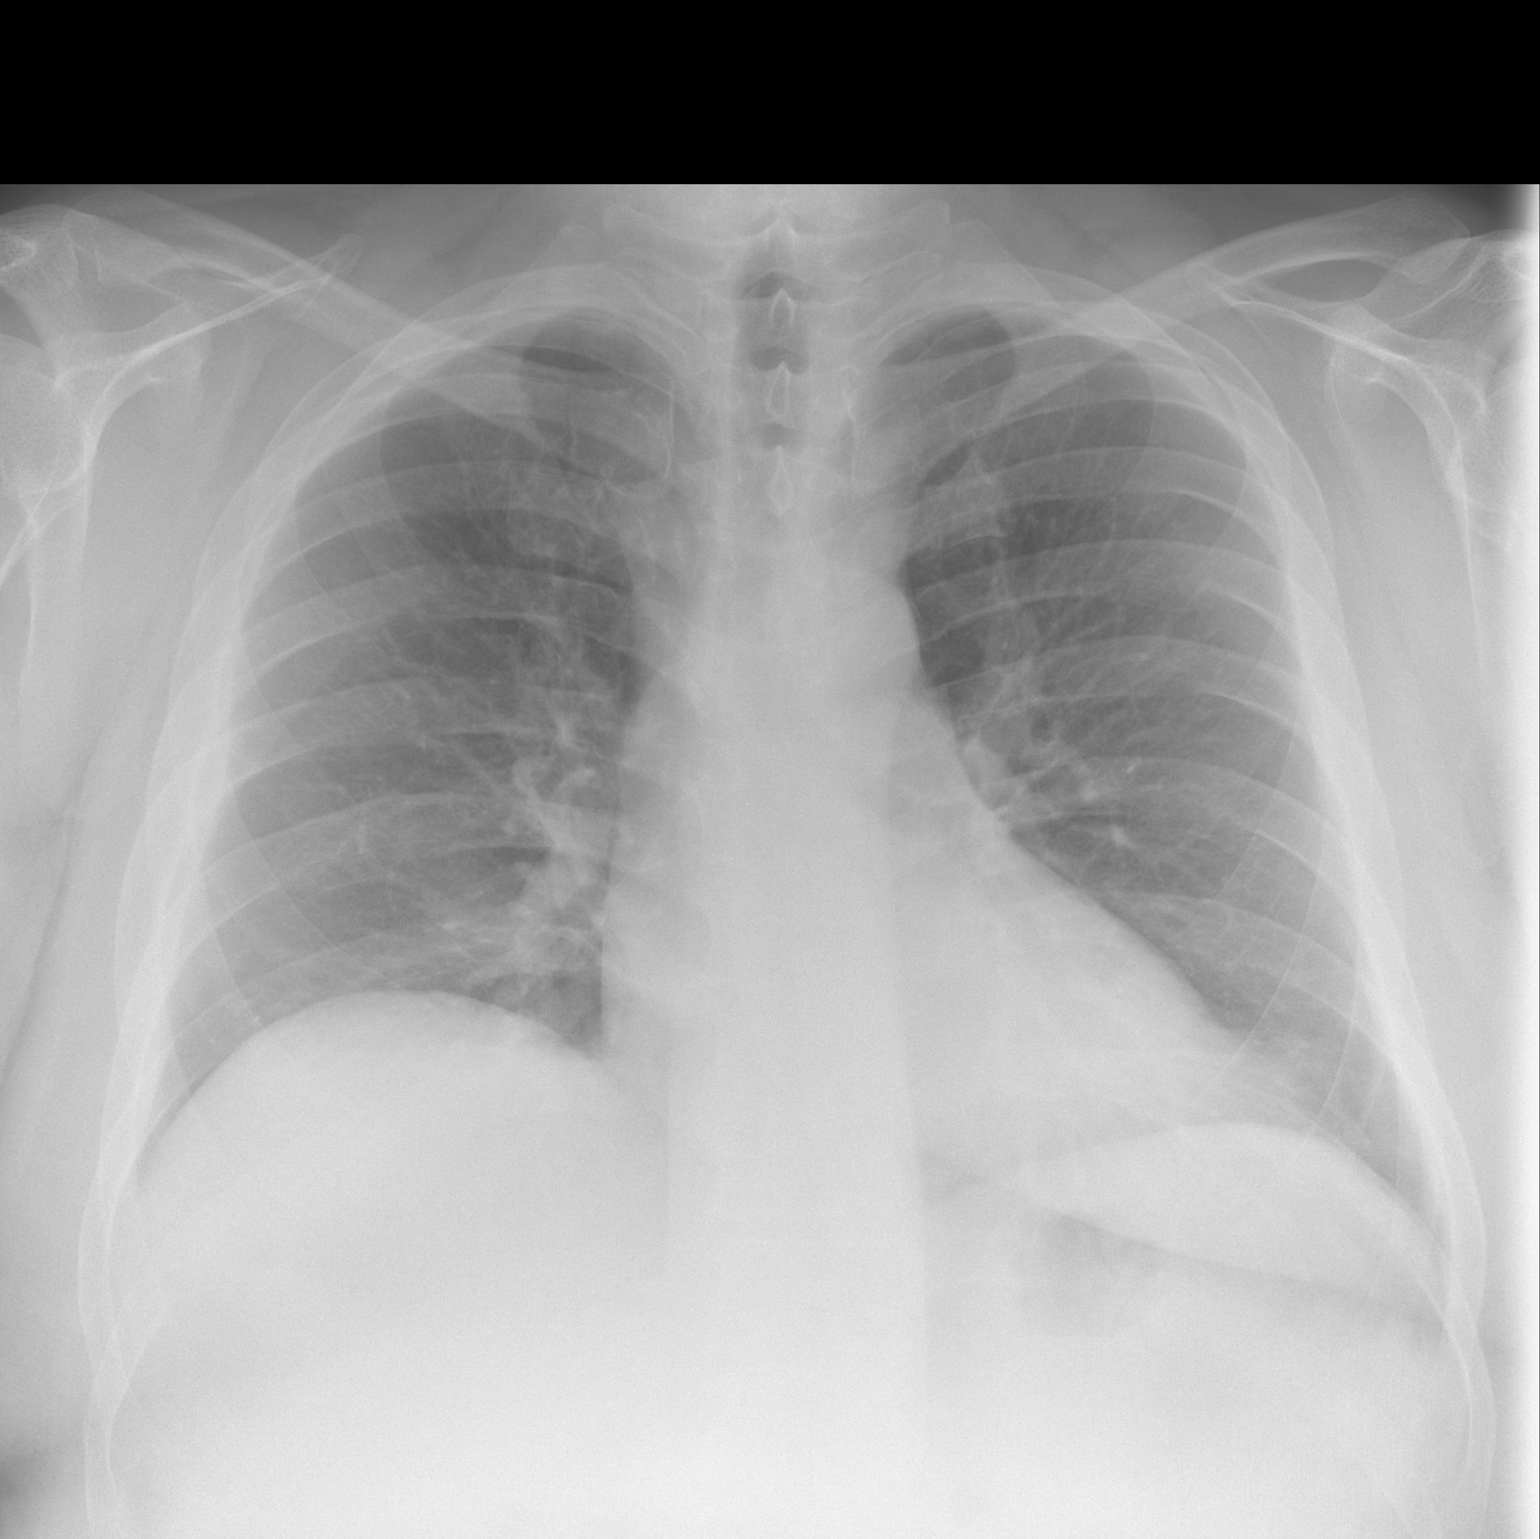

[w chest lat]
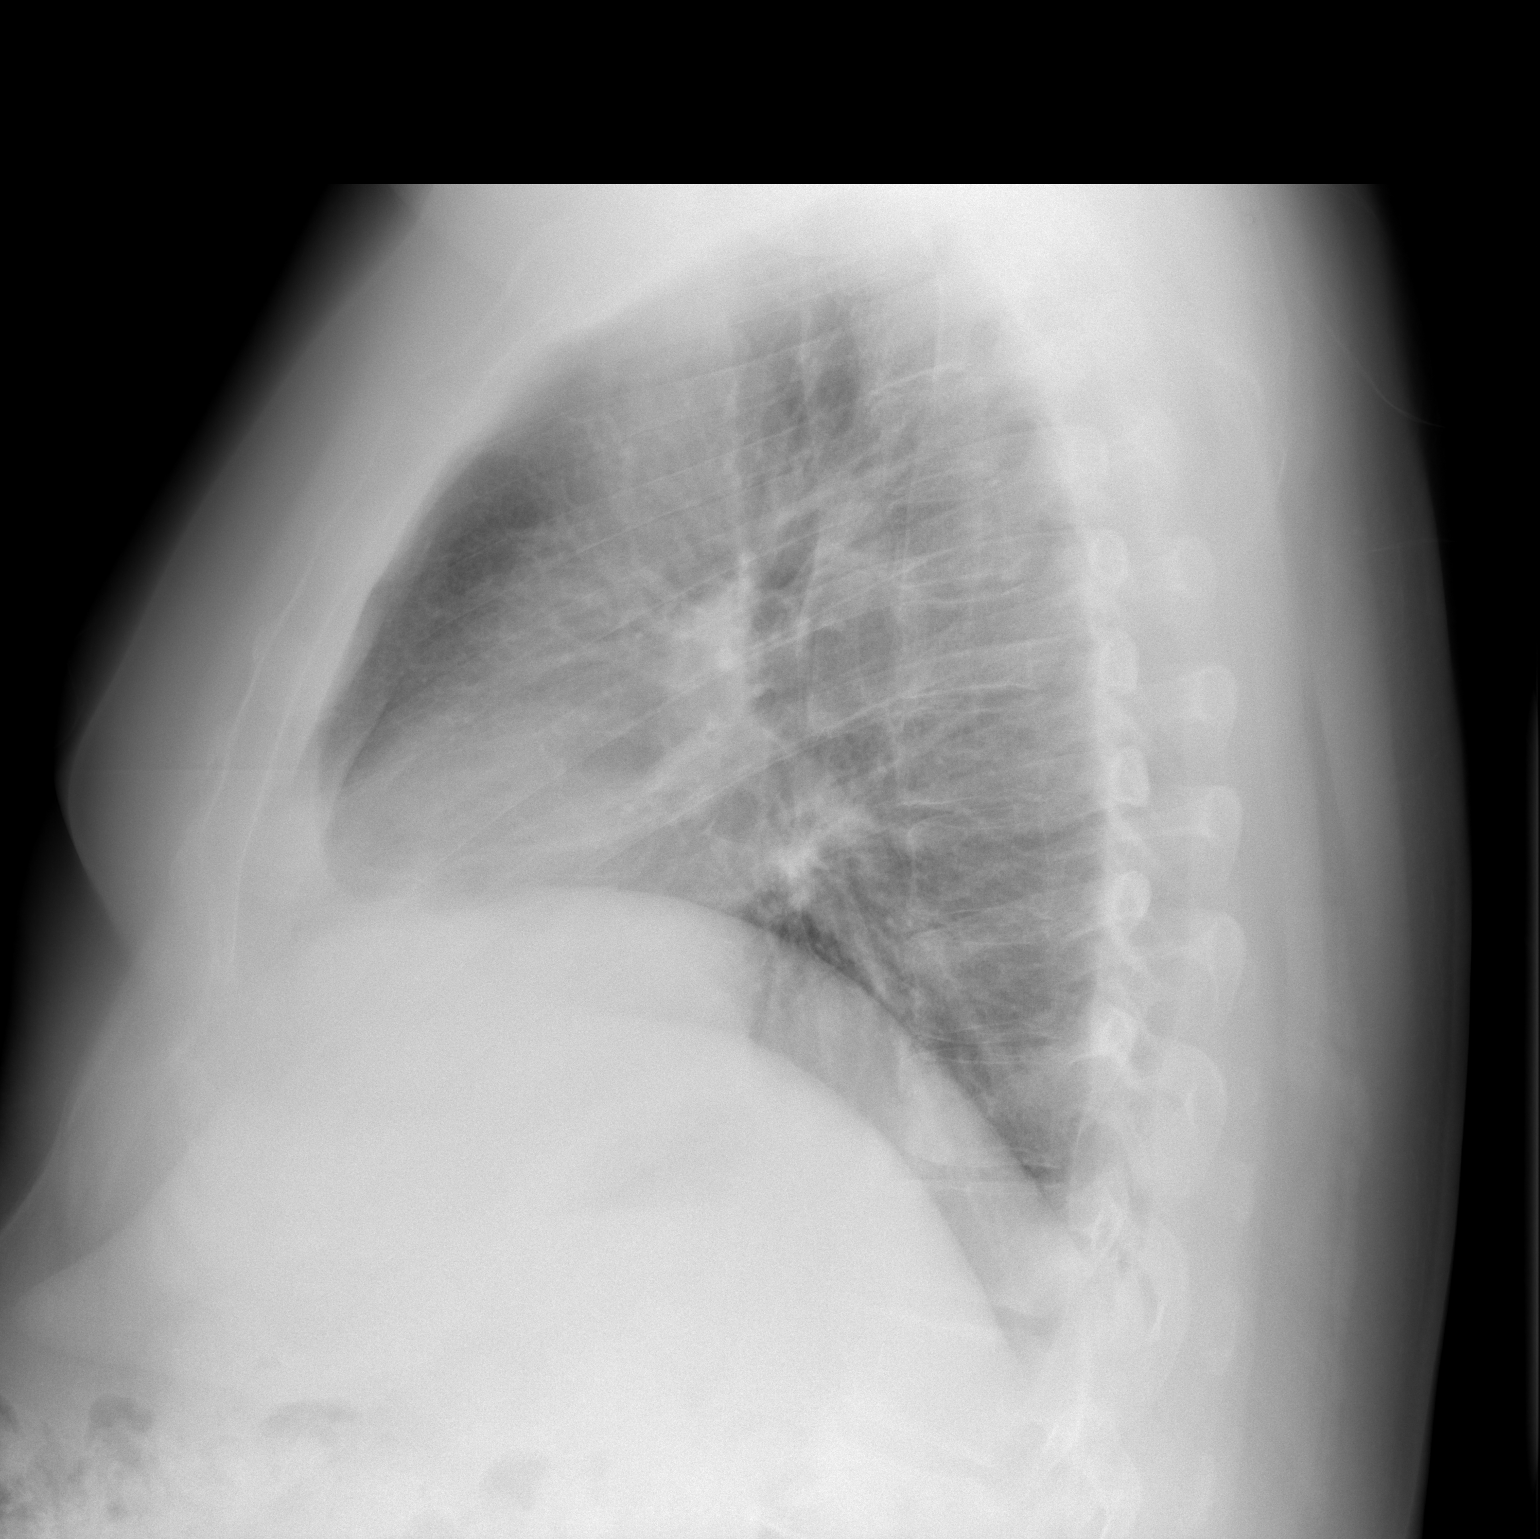

[2 of 2 positions shown; findings below may reference images not displayed]

FINDINGS: Mediastinum hilar structures normal. Minimal left base infiltrate
cannot be excluded. Heart size is stable. No pleural effusion or
pneumothorax. No acute bony abnormality.
IMPRESSION: Minimal left base infiltrate cannot be excluded.

## 2018-11-27 ENCOUNTER — Other Ambulatory Visit (HOSPITAL_COMMUNITY): Payer: Self-pay | Admitting: Urology

## 2018-11-27 ENCOUNTER — Other Ambulatory Visit: Payer: Self-pay

## 2018-11-27 ENCOUNTER — Ambulatory Visit (HOSPITAL_COMMUNITY)
Admission: RE | Admit: 2018-11-27 | Discharge: 2018-11-27 | Disposition: A | Discharge status: Home / Self Care | Payer: PRIVATE HEALTH INSURANCE | Source: Ambulatory Visit | Attending: Urology | Admitting: Urology

## 2018-11-27 DIAGNOSIS — C642 Malignant neoplasm of left kidney, except renal pelvis: Secondary | ICD-10-CM

## 2018-12-29 ENCOUNTER — Other Ambulatory Visit: Payer: Self-pay

## 2018-12-29 DIAGNOSIS — Z20822 Contact with and (suspected) exposure to covid-19: Secondary | ICD-10-CM

## 2018-12-30 LAB — NOVEL CORONAVIRUS, NAA: SARS-CoV-2, NAA: NOT DETECTED

## 2019-01-22 ENCOUNTER — Other Ambulatory Visit: Payer: Self-pay | Admitting: *Deleted

## 2019-01-22 DIAGNOSIS — Z20822 Contact with and (suspected) exposure to covid-19: Secondary | ICD-10-CM

## 2019-01-24 LAB — NOVEL CORONAVIRUS, NAA: SARS-CoV-2, NAA: NOT DETECTED

## 2019-07-24 ENCOUNTER — Ambulatory Visit: Payer: PRIVATE HEALTH INSURANCE | Attending: Internal Medicine

## 2019-07-24 DIAGNOSIS — Z23 Encounter for immunization: Secondary | ICD-10-CM

## 2019-07-24 NOTE — Progress Notes (Signed)
   Covid-19 Vaccination Clinic  Name:  Edwin Rogers    MRN: [id number] DOB: 30-Dec-1970  07/24/2019  Mr. [name] was observed post Covid-19 immunization for 15 minutes without incident. He was provided with Vaccine Information Sheet and instruction to access the V-Safe system.   Mr. [name] was instructed to call 911 with any severe reactions post vaccine: Marland Kitchen Difficulty breathing  . Swelling of face and throat  . A fast heartbeat  . A bad rash all over body  . Dizziness and weakness   Immunizations Administered    Name Date Dose VIS Date Route   Moderna COVID-19 Vaccine 07/24/2019  8:26 AM 0.5 mL 04/21/2019 Intramuscular   Manufacturer: Moderna   Lot: [id number]   [place]:[id number]

## 2019-08-18 ENCOUNTER — Other Ambulatory Visit: Payer: Self-pay

## 2019-08-18 ENCOUNTER — Ambulatory Visit: Payer: PRIVATE HEALTH INSURANCE | Attending: Internal Medicine

## 2019-08-18 DIAGNOSIS — Z20822 Contact with and (suspected) exposure to covid-19: Secondary | ICD-10-CM

## 2019-08-19 LAB — SARS-COV-2, NAA 2 DAY TAT

## 2019-08-19 LAB — NOVEL CORONAVIRUS, NAA: SARS-CoV-2, NAA: NOT DETECTED

## 2019-08-26 ENCOUNTER — Ambulatory Visit: Payer: PRIVATE HEALTH INSURANCE | Attending: Internal Medicine

## 2019-08-26 DIAGNOSIS — Z23 Encounter for immunization: Secondary | ICD-10-CM

## 2019-08-26 NOTE — Progress Notes (Signed)
   Covid-19 Vaccination Clinic  Name:  Edwin Rogers    MRN: [id number] DOB: [birth year]  08/26/2019  [name] was observed post Covid-19 immunization for 15 minutes without incident. He was provided with Vaccine Information Sheet and instruction to access the V-Safe system.   [name] was instructed to call 911 with any severe reactions post vaccine: Marland Kitchen Difficulty breathing  . Swelling of face and throat  . A fast heartbeat  . A bad rash all over body  . Dizziness and weakness   Immunizations Administered    Name Date Dose VIS Date Route   Moderna COVID-19 Vaccine 08/26/2019  8:23 AM 0.5 mL 04/21/2019 Intramuscular   Manufacturer: Levan Hurst   LotFY:[id number]   [place]:[id number]

## 2019-09-08 ENCOUNTER — Telehealth (INDEPENDENT_AMBULATORY_CARE_PROVIDER_SITE_OTHER): Payer: Self-pay

## 2019-09-08 NOTE — Telephone Encounter (Signed)
5/20 @ 1PM

## 2019-09-08 NOTE — Telephone Encounter (Signed)
We can accept him to the practice, please make him a new patient appointment with me.  Thanks.

## 2019-09-18 ENCOUNTER — Encounter (INDEPENDENT_AMBULATORY_CARE_PROVIDER_SITE_OTHER): Payer: Self-pay | Admitting: *Deleted

## 2019-10-08 ENCOUNTER — Other Ambulatory Visit: Payer: Self-pay

## 2019-10-08 ENCOUNTER — Encounter (INDEPENDENT_AMBULATORY_CARE_PROVIDER_SITE_OTHER): Payer: Self-pay | Admitting: Internal Medicine

## 2019-10-08 ENCOUNTER — Ambulatory Visit (INDEPENDENT_AMBULATORY_CARE_PROVIDER_SITE_OTHER): Payer: PRIVATE HEALTH INSURANCE | Admitting: Internal Medicine

## 2019-10-08 VITALS — BP 140/90 | HR 63 | Temp 97.5°F | Ht 75.6 in | Wt 363.0 lb

## 2019-10-08 DIAGNOSIS — E559 Vitamin D deficiency, unspecified: Secondary | ICD-10-CM | POA: Diagnosis not present

## 2019-10-08 DIAGNOSIS — K219 Gastro-esophageal reflux disease without esophagitis: Secondary | ICD-10-CM | POA: Diagnosis not present

## 2019-10-08 DIAGNOSIS — R5381 Other malaise: Secondary | ICD-10-CM

## 2019-10-08 DIAGNOSIS — Z131 Encounter for screening for diabetes mellitus: Secondary | ICD-10-CM

## 2019-10-08 DIAGNOSIS — R6882 Decreased libido: Secondary | ICD-10-CM

## 2019-10-08 DIAGNOSIS — R5383 Other fatigue: Secondary | ICD-10-CM

## 2019-10-08 DIAGNOSIS — N529 Male erectile dysfunction, unspecified: Secondary | ICD-10-CM

## 2019-10-08 NOTE — Progress Notes (Signed)
Metrics: Intervention Frequency ACO  Documented Smoking Status Yearly  Screened one or more times in 24 months  Cessation Counseling or  Active cessation medication Past 24 months  Past 24 months   Guideline developer: UpToDate (See UpToDate for funding source) Date Released: 2014       Wellness Office Visit  Subjective:  Patient ID: Edwin Rogers, male    DOB: 08/18/1970  Age: 49 y.o. MRN: [id number]  CC: This 49 year old man comes in as a new patient to establish care. HPI  His main concern is his morbid obesity and his desire to lose weight and become healthier.  He tells me that as an adult the least his weight was around 210 pounds. Over the years, he has gained weight. He has a diagnosis of sleep apnea. He has a diagnosis previously of hypertension which is improved after he had nephrectomy from kidney tumor. He describes significant fatigue, decreased libido and erectile dysfunction. He suffers from gastroesophageal reflux disease and has to take medication for this. Past Medical History:  Diagnosis Date  . GERD (gastroesophageal reflux disease)   . Hypertension    pt states currently on no medications.  . Left groin hernia   . Sleep apnea    Diagnosed 2014-uses CPAP.      Family History  Problem Relation Age of Onset  . Colon cancer Father   . Kidney disease Father   . Colon cancer Other   . Colon cancer Other   . 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   Social History   Tobacco Use  . Smoking status: Never Smoker  . Smokeless tobacco: Never Used  Substance Use Topics  . Alcohol use: No    Current Meds  Medication Sig  . famotidine-calcium carbonate-magnesium hydroxide (PEPCID COMPLETE) [phone] MG CHEW chewable tablet Chew 1 tablet by mouth at bedtime. Heartburn        Depression screen [facility] 2/9 10/08/2019    Decreased Interest 0  Down, Depressed, Hopeless 0  PHQ - 2 Score 0     Objective:   Today's Vitals: BP 140/90 (BP Location: Left Arm, Patient Position: Sitting, Cuff Size: Normal)   Pulse 63   Temp (!) 97.5 F (36.4 C) (Temporal)   Ht 6' 3.6" (1.92 m)   Wt (!) 363 lb (164.7 kg)   SpO2 98%   BMI 44.65 kg/m  Vitals with BMI 10/08/2019 05/29/2017 09/26/2016  Height 6' 3.6" 6\' 1"  -  Weight 363 lbs 328 lbs 2 oz -  BMI XX123456 0000000 -  Systolic [id number]  Diastolic 90 80 77  Pulse 63 72 59     Physical Exam   He looks systemically well but is morbidly obese.  Blood pressure elevated.  Alert and orientated without any obvious focal neurological signs.    Assessment   1. Gastroesophageal reflux disease without esophagitis   2. Morbid obesity ([place])   3. Malaise and fatigue   4. Vitamin D deficiency disease   5. Decreased libido   6. Erectile dysfunction, unspecified erectile dysfunction type   7. Screening for diabetes mellitus       Tests ordered Orders Placed This Encounter  Procedures  . Cardio IQ Adv Lipid and Inflamm Pnl  . CBC  . COMPLETE METABOLIC PANEL WITH GFR  . Hemoglobin A1c  . T3, free  . T4  .  TSH  . Testosterone Total,Free,Bio, Males  . VITAMIN D 25 Hydroxy (Vit-D Deficiency, Fractures)     Plan: 1. Blood work is ordered above. 2. I discussed the importance of his diagnosis of morbid obesity and implications to his health including early death.  We discussed nutrition briefly and today I introduced him to the concept of intermittent fasting, maintaining hydration.  I want him to drink 1 gallon of water a day on average and staying away from soft drinks and other sugary drinks.  I gave him a diet sheet to look over and we will discuss this more on the next visit. 3. Follow-up in about 3 to 4 weeks. 4. Today I spent 45 minutes with this patient discussing his overall health and nutrition.   No orders of the defined types were placed in this  encounter.   Doree Albee, MD

## 2019-10-08 NOTE — Patient Instructions (Signed)
[facility] Dietary Recommendations for Weight Loss What to Avoid . Avoid added sugars o Often added sugar can be found in processed foods such as many condiments, dry cereals, cakes, cookies, chips, crisps, crackers, candies, sweetened drinks, etc.  o Read labels and AVOID/DECREASE use of foods with the following in their ingredient list: Sugar, fructose, high fructose corn syrup, sucrose, glucose, maltose, dextrose, molasses, cane sugar, brown sugar, any type of syrup, agave nectar, etc.   . Avoid snacking in between meals . Avoid foods made with flour o If you are going to eat food made with flour, choose those made with whole-grains; and, minimize your consumption as much as is tolerable . Avoid processed foods o These foods are generally stocked in the middle of the grocery store. Focus on shopping on the perimeter of the grocery.  . Avoid Meat  o We recommend following a plant-based diet at [facility]. Thus, we recommend avoiding meat as a general rule. Consider eating beans, legumes, eggs, and/or dairy products for regular protein sources o If you plan on eating meat limit to 4 ounces of meat at a time and choose lean options such as Fish, chicken, turkey. Avoid red meat intake such as pork and/or steak What to Include . Vegetables o GREEN LEAFY VEGETABLES: Kale, spinach, mustard greens, collard greens, cabbage, broccoli, etc. o OTHER: Asparagus, cauliflower, eggplant, carrots, peas, Brussel sprouts, tomatoes, bell peppers, zucchini, beets, cucumbers, etc. . Grains, seeds, and legumes o Beans: kidney beans, black eyed peas, garbanzo beans, black beans, pinto beans, etc. o Whole, unrefined grains: brown rice, barley, bulgur, oatmeal, etc. . Healthy fats  o Avoid highly processed fats such as vegetable oil o Examples of healthy fats: avocado, olives, virgin olive oil, dark chocolate (?72% Cocoa), nuts (peanuts, almonds, walnuts, cashews, pecans, etc.) . None to Low  Intake of Animal Sources of Protein o Meat sources: chicken, turkey, salmon, tuna. Limit to 4 ounces of meat at one time. o Consider limiting dairy sources, but when choosing dairy focus on: PLAIN Greek yogurt, cottage cheese, high-protein milk . Fruit o Choose berries  When to Eat . Intermittent Fasting: o Choosing not to eat for a specific time period, but DO FOCUS ON HYDRATION when fasting o Multiple Techniques: - Time Restricted Eating: eat 3 meals in a day, each meal lasting no more than 60 minutes, no snacks between meals - 16-18 hour fast: fast for 16 to 18 hours up to 7 days a week. Often suggested to start with 2-3 nonconsecutive days per week.  . Remember the time you sleep is counted as fasting.  . Examples of eating schedule: Fast from 7:00pm-11:00am. Eat between 11:00am-7:00pm.  - 24-hour fast: fast for 24 hours up to every other day. Often suggested to start with 1 day per week . Remember the time you sleep is counted as fasting . Examples of eating schedule:  o Eating day: eat 2-3 meals on your eating day. If doing 2 meals, each meal should last no more than 90 minutes. If doing 3 meals, each meal should last no more than 60 minutes. Finish last meal by 7:00pm. o Fasting day: Fast until 7:00pm.  o IF YOU FEEL UNWELL FOR ANY REASON/IN ANY WAY WHEN FASTING, STOP FASTING BY EATING A NUTRITIOUS SNACK OR LIGHT MEAL o ALWAYS FOCUS ON HYDRATION DURING FASTS - Acceptable Hydration sources: water, broths, tea/coffee (black tea/coffee is best but using a small amount of whole-fat dairy products in coffee/tea is acceptable).  -   Poor Hydration Sources: anything with sugar or artificial sweeteners added to it  These recommendations have been developed for patients that are actively receiving medical care from either Dr. Asael Pann or Sarah Gray, DNP, NP-C at [facility]. These recommendations are developed for patients with specific medical conditions and are not meant to be  distributed or used by others that are not actively receiving care from either provider listed above at [facility]. It is not appropriate to participate in the above eating plans without proper medical supervision.   Reference: Fung, J. The obesity code. [place]: [place]; 2016.   

## 2019-10-14 LAB — COMPLETE METABOLIC PANEL WITH GFR
AG Ratio: 1.3 (calc) (ref 1.0–2.5)
ALT: 35 U/L (ref 9–46)
AST: 24 U/L (ref 10–40)
Albumin: 4 g/dL (ref 3.6–5.1)
Alkaline phosphatase (APISO): 114 U/L (ref 36–130)
BUN: 14 mg/dL (ref 7–25)
CO2: 24 mmol/L (ref 20–32)
Calcium: 9.4 mg/dL (ref 8.6–10.3)
Chloride: 104 mmol/L (ref 98–110)
Creat: 1.1 mg/dL (ref 0.60–1.35)
GFR, Est African American: 91 mL/min/{1.73_m2} (ref >=60)
GFR, Est Non African American: 78 mL/min/{1.73_m2} (ref >=60)
Globulin: 3.1 g/dL (calc) (ref 1.9–3.7)
Glucose, Bld: 130 mg/dL — ABNORMAL HIGH (ref 65–99)
Potassium: 4.5 mmol/L (ref 3.5–5.3)
Sodium: 140 mmol/L (ref 135–146)
Total Bilirubin: 0.3 mg/dL (ref 0.2–1.2)
Total Protein: 7.1 g/dL (ref 6.1–8.1)

## 2019-10-14 LAB — TESTOSTERONE TOTAL,FREE,BIO, MALES
Albumin: 4 g/dL (ref 3.6–5.1)
Sex Hormone Binding: 28 nmol/L (ref 10–50)
Testosterone: 18 ng/dL — ABNORMAL LOW (ref 250–827)

## 2019-10-14 LAB — CARDIO IQ ADV LIPID AND INFLAMM PNL
Apolipoprotein B: 49 mg/dL (ref <90)
Cholesterol: 86 mg/dL (ref <200)
HDL: 33 mg/dL — ABNORMAL LOW (ref >39)
LDL Cholesterol (Calc): 35 mg/dL (calc) (ref <100)
LDL Large: 6284 nmol/L — ABNORMAL LOW (ref >6729)
LDL Medium: 170 nmol/L (ref <215)
LDL Particle Number: 904 nmol/L (ref <1138)
LDL Peak Size: 216.2 Angstrom — ABNORMAL LOW (ref >222.9)
LDL Small: 179 nmol/L — ABNORMAL HIGH (ref <142)
Lipoprotein (a): 58 nmol/L (ref <75)
Non-HDL Cholesterol (Calc): 53 mg/dL (calc) (ref <130)
PLAC: 69 nmol/min/mL (ref <124)
Total CHOL/HDL Ratio: 2.6 calc (ref <3.6)
Triglycerides: 98 mg/dL (ref <150)
hs-CRP: >10 mg/L — ABNORMAL HIGH (ref <1.0)

## 2019-10-14 LAB — HEMOGLOBIN A1C
Hgb A1c MFr Bld: 5.8 % of total Hgb — ABNORMAL HIGH (ref <5.7)
Mean Plasma Glucose: 120 (calc)
eAG (mmol/L): 6.6 (calc)

## 2019-10-14 LAB — VITAMIN D 25 HYDROXY (VIT D DEFICIENCY, FRACTURES): Vit D, 25-Hydroxy: 16 ng/mL — ABNORMAL LOW (ref 30–100)

## 2019-10-14 LAB — CBC
HCT: 36.9 % — ABNORMAL LOW (ref 38.5–50.0)
Hemoglobin: 12.1 g/dL — ABNORMAL LOW (ref 13.2–17.1)
MCH: 28.3 pg (ref 27.0–33.0)
MCHC: 32.8 g/dL (ref 32.0–36.0)
MCV: 86.4 fL (ref 80.0–100.0)
MPV: 11.3 fL (ref 7.5–12.5)
Platelets: 189 10*3/uL (ref 140–400)
RBC: 4.27 10*6/uL (ref 4.20–5.80)
RDW: 12.8 % (ref 11.0–15.0)
WBC: 7.2 10*3/uL (ref 3.8–10.8)

## 2019-10-14 LAB — T3, FREE: T3, Free: 2.9 pg/mL (ref 2.3–4.2)

## 2019-10-14 LAB — TSH: TSH: 3.13 mIU/L (ref 0.40–4.50)

## 2019-10-14 LAB — T4: T4, Total: 7.9 ug/dL (ref 4.9–10.5)

## 2019-11-17 ENCOUNTER — Ambulatory Visit (INDEPENDENT_AMBULATORY_CARE_PROVIDER_SITE_OTHER): Payer: PRIVATE HEALTH INSURANCE | Admitting: Internal Medicine

## 2019-11-17 ENCOUNTER — Other Ambulatory Visit: Payer: Self-pay

## 2019-11-17 ENCOUNTER — Encounter (INDEPENDENT_AMBULATORY_CARE_PROVIDER_SITE_OTHER): Payer: Self-pay | Admitting: Internal Medicine

## 2019-11-17 DIAGNOSIS — E559 Vitamin D deficiency, unspecified: Secondary | ICD-10-CM | POA: Diagnosis not present

## 2019-11-17 DIAGNOSIS — N529 Male erectile dysfunction, unspecified: Secondary | ICD-10-CM | POA: Diagnosis not present

## 2019-11-17 DIAGNOSIS — E291 Testicular hypofunction: Secondary | ICD-10-CM | POA: Diagnosis not present

## 2019-11-17 DIAGNOSIS — D649 Anemia, unspecified: Secondary | ICD-10-CM

## 2019-11-17 NOTE — Progress Notes (Signed)
Metrics: Intervention Frequency ACO  Documented Smoking Status Yearly  Screened one or more times in 24 months  Cessation Counseling or  Active cessation medication Past 24 months  Past 24 months   Guideline developer: UpToDate (See UpToDate for funding source) Date Released: 2014       Wellness Office Visit  Subjective:  Patient ID: Edwin Rogers, male    DOB: 1970/09/29  Age: 49 y.o. MRN: [id number]  CC: This man comes in to discuss all his blood results and further recommendations. HPI  His main issue is 1 of morbid obesity.  He has been trying to lose weight since the last time I saw him and has been consistent with intermittent fasting 16 hours on most every day.  He tries to drink a gallon of water but usually manages at least half a gallon.  As a result, he has lost weight and feels that his clothes are looser. I discussed his blood results today in detail. He has vitamin D deficiency. He has significant low testosterone levels. His cardio IQ lipid panel shows a high risk profile. His C-reactive protein is elevated. He is prediabetic based on a hemoglobin A1c of 5.8%. He has a normocytic anemia.  In fact he has been anemic for almost 4 years.  He did have a colonoscopy in May 2018 which showed benign polyps and hemorrhoids but no other major abnormalities.  He is due for repeat colonoscopy now. Past Medical History:  Diagnosis Date  . GERD (gastroesophageal reflux disease)   . Hypertension    pt states currently on no medications.  . Left groin hernia   . Sleep apnea    Diagnosed 2014-uses CPAP.   Past Surgical History:  Procedure Laterality Date  . ANKLE FRACTURE SURGERY     last year with a plate  . CHOLECYSTECTOMY  09/22/2015  . COLONOSCOPY N/A 05/27/2013   Procedure: COLONOSCOPY;  Surgeon: Rogene Houston, MD;  [facility];  Service: Endoscopy;  Laterality: N/A;  100  . COLONOSCOPY N/A 09/26/2016   Procedure: COLONOSCOPY;  Surgeon: Rogene Houston,  MD;  [facility];  Service: Endoscopy;  Laterality: N/A;  1:00  . ORIF ANKLE FRACTURE  05/10/2012   Procedure: OPEN REDUCTION INTERNAL FIXATION (ORIF) ANKLE FRACTURE;  Surgeon: Marin Shutter, MD;  Location: [place];  Service: Orthopedics;  Laterality: Right;  . POLYPECTOMY  09/26/2016   Procedure: POLYPECTOMY;  Surgeon: Rogene Houston, MD;  [facility];  Service: Endoscopy;;  colon  . ROBOT ASSISTED LAPAROSCOPIC NEPHRECTOMY N/A 09/22/2015   Procedure: XI ROBOTIC ASSISTED LAPAROSCOPIC LEFT RADIAL NEPHRECTOMY WITH EXTENSIVE LAPAROSCOPIC ADHESIOLYSIS ;  Surgeon: Alexis Frock, MD;  [facility];  Service: Urology;  Laterality: N/A;     Family History  Problem Relation Age of Onset  . Colon cancer Father   . Kidney disease Father   . Colon cancer Other   . Colon cancer Other   . 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   Social History   Tobacco Use  . Smoking status: Never Smoker  . Smokeless tobacco: Never Used  Substance Use Topics  . Alcohol use: No    Current Meds  Medication Sig  . famotidine-calcium carbonate-magnesium hydroxide (PEPCID COMPLETE) [phone] MG CHEW chewable tablet Chew 1 tablet by mouth at bedtime. Heartburn  Depression screen [facility] 2/9 10/08/2019  Decreased Interest 0  Down, Depressed, Hopeless 0  PHQ - 2 Score 0     Objective:   Today's Vitals: BP 130/90 (BP Location: Left Arm, Patient Position: Sitting, Cuff Size: Normal)   Pulse 67   Temp 97.8 F (36.6 C) (Temporal)   Ht 6\' 2"  (1.88 m)   Wt (!) 360 lb (163.3 kg)   SpO2 97%   BMI 46.22 kg/m  Vitals with BMI 11/17/2019 10/08/2019 05/29/2017  Height 6\' 2"  6' 3.6" 6\' 1"   Weight 360 lbs 363 lbs 328 lbs 2 oz  BMI 46.2 54.65 03.5  Systolic 465 681 275  Diastolic 90 90 80  Pulse 67 63 72     Physical Exam  He looks systemically  well.  He remains morbidly obese but has lost 3 pounds since the last time I saw him.     Assessment   1. Morbid obesity ([place])   2. Male hypogonadism   3. Vitamin D deficiency disease   4. Erectile dysfunction, unspecified erectile dysfunction type   5. Anemia, unspecified type       Tests ordered Orders Placed This Encounter  Procedures  . Ferritin  . B12 and Folate Panel  . Prolactin  . Iron and TIBC     Plan: 1. As far as his anemia is concerned, we will check iron studies, B12 and folate.  I have told him to get in touch with his gastroenterologist for repeat colonoscopy as recommended. 2. His severe hypogonadism is likely due to his morbid obesity but we will check a prolactin level to make sure this is normal.  If it is abnormal, we may need to do an MRI scan of his brain to look at the pituitary gland. 3. For his vitamin D deficiency, I have recommended vitamin D3 10,000 units daily. 4. Today we discussed nutrition in more detail and apart from intermittent fasting, I also recommended that he start eating healthier food.  I discussed a plant-based diet.  As far as intermittent fasting is concerned, he can even go longer because he finds it easy to do 16 hours.  I told him to make sure he is well-hydrated and also salt repleted when he does this.  We discussed various food sources that are going to be healthier and mostly these are going to be plant-based and [employer]. 5. I will see him in about a month's time to see how he is doing. 6. Today I spent at least 40 minutes with this patient discussing all his results in detail and more information on nutrition.   No orders of the defined types were placed in this encounter.   Doree Albee, MD

## 2019-11-18 ENCOUNTER — Other Ambulatory Visit (INDEPENDENT_AMBULATORY_CARE_PROVIDER_SITE_OTHER): Payer: Self-pay | Admitting: Internal Medicine

## 2019-11-18 ENCOUNTER — Ambulatory Visit (HOSPITAL_COMMUNITY): Admission: RE | Admit: 2019-11-18 | Payer: PRIVATE HEALTH INSURANCE | Source: Ambulatory Visit

## 2019-11-18 DIAGNOSIS — R7989 Other specified abnormal findings of blood chemistry: Secondary | ICD-10-CM

## 2019-11-18 DIAGNOSIS — E299 Testicular dysfunction, unspecified: Secondary | ICD-10-CM

## 2019-11-18 DIAGNOSIS — E291 Testicular hypofunction: Secondary | ICD-10-CM

## 2019-11-18 LAB — IRON,TIBC AND FERRITIN PANEL
%SAT: 29 % (calc) (ref 20–48)
Ferritin: 150 ng/mL (ref 38–380)
Iron: 76 ug/dL (ref 50–180)
TIBC: 263 mcg/dL (calc) (ref 250–425)

## 2019-11-18 LAB — PROLACTIN: Prolactin: 683.1 ng/mL — ABNORMAL HIGH (ref 2.0–18.0)

## 2019-11-18 LAB — B12 AND FOLATE PANEL
Folate: 4.8 ng/mL — ABNORMAL LOW
Vitamin B-12: 260 pg/mL (ref 200–1100)

## 2019-11-18 NOTE — Progress Notes (Signed)
Submitted and fax to insurance. [facility] is at [facility] de[facility].

## 2019-11-18 NOTE — Progress Notes (Signed)
Please arrange MRI brain scan on this patient urgently. I put the order in.

## 2019-11-19 ENCOUNTER — Ambulatory Visit (HOSPITAL_COMMUNITY)
Admission: RE | Admit: 2019-11-19 | Discharge: 2019-11-19 | Disposition: A | Discharge status: Home / Self Care | Payer: PRIVATE HEALTH INSURANCE | Source: Ambulatory Visit | Attending: Internal Medicine | Admitting: Internal Medicine

## 2019-11-19 ENCOUNTER — Other Ambulatory Visit (INDEPENDENT_AMBULATORY_CARE_PROVIDER_SITE_OTHER): Payer: Self-pay

## 2019-11-19 ENCOUNTER — Other Ambulatory Visit: Payer: Self-pay

## 2019-11-19 DIAGNOSIS — D497 Neoplasm of unspecified behavior of endocrine glands and other parts of nervous system: Secondary | ICD-10-CM

## 2019-11-19 DIAGNOSIS — E291 Testicular hypofunction: Secondary | ICD-10-CM | POA: Insufficient documentation

## 2019-11-19 DIAGNOSIS — E299 Testicular dysfunction, unspecified: Secondary | ICD-10-CM | POA: Diagnosis not present

## 2019-11-19 DIAGNOSIS — R7989 Other specified abnormal findings of blood chemistry: Secondary | ICD-10-CM

## 2019-11-19 NOTE — Progress Notes (Signed)
Dr Anastasio Champion called gave visa verbal order to put in two referrals for Endocrinology & Neurosurgery. Call to see if we can get patient into a office appointment ASAP. Dr Anastasio Champion called and notified patient of results from MRI.

## 2019-11-26 ENCOUNTER — Ambulatory Visit (INDEPENDENT_AMBULATORY_CARE_PROVIDER_SITE_OTHER): Payer: PRIVATE HEALTH INSURANCE | Admitting: Internal Medicine

## 2019-11-26 ENCOUNTER — Encounter: Payer: Self-pay | Admitting: Internal Medicine

## 2019-11-26 ENCOUNTER — Other Ambulatory Visit: Payer: Self-pay

## 2019-11-26 VITALS — BP 130/80 | HR 64 | Ht 74.0 in | Wt 361.0 lb

## 2019-11-26 DIAGNOSIS — R7989 Other specified abnormal findings of blood chemistry: Secondary | ICD-10-CM | POA: Diagnosis not present

## 2019-11-26 DIAGNOSIS — E291 Testicular hypofunction: Secondary | ICD-10-CM | POA: Diagnosis not present

## 2019-11-26 DIAGNOSIS — D497 Neoplasm of unspecified behavior of endocrine glands and other parts of nervous system: Secondary | ICD-10-CM

## 2019-11-26 LAB — T3, FREE: T3, Free: 3.4 pg/mL (ref 2.3–4.2)

## 2019-11-26 LAB — CORTISOL: Cortisol, Plasma: 10.5 ug/dL

## 2019-11-26 LAB — TSH: TSH: 4.62 u[IU]/mL — ABNORMAL HIGH (ref 0.35–4.50)

## 2019-11-26 LAB — FOLLICLE STIMULATING HORMONE: FSH: 0.6 m[IU]/mL — ABNORMAL LOW (ref 1.4–18.1)

## 2019-11-26 LAB — LUTEINIZING HORMONE: LH: 0.17 m[IU]/mL — ABNORMAL LOW (ref 1.50–9.30)

## 2019-11-26 LAB — T4, FREE: Free T4: 0.76 ng/dL (ref 0.60–1.60)

## 2019-11-26 NOTE — Patient Instructions (Addendum)
Please stop at the lab.  Try to get a visual field test by your ophthalmologist.  Please come back for a follow-up appointment in 6 months.   Pituitary Tumors  Pituitary tumors are abnormal growths found in the pituitary gland. The pituitary gland is a small organ in the center of the brain. It makes hormones that affect growth and the functions of other glands in the body. In most cases, pituitary tumors grow slowly, are not cancerous (benign), and do not spread to other parts of the body. These tumors are best treated when they are found and diagnosed early. A pituitary tumor may produce hormones (functioning tumor) or not (non-functioning tumor). A pituitary tumor may cause:  Cushing disease. In this disease, the pituitary gland produces too much of a hormone called cortisol. This causes fat to build up in the face, back, and chest while the arms and legs become thin.  Acromegaly. This is a condition in which the hands, feet, and face are larger than normal.  Breast milk production, even when there is no pregnancy. What are the causes? The cause of most pituitary tumors is not known. In some cases, pituitary tumors may be passed from parent to child (inherited). What increases the risk? You are more likely to develop this condition if:  You have a family history of pituitary tumors.  You have certain syndromes caused by unwanted changes (mutations) in your genes. What are the signs or symptoms? Symptoms of this condition include:  Headaches.  Loss of consciousness.  Vision problems and eye muscle weakness.  Weakness or low energy.  Clear fluid draining from the nose.  Changes in the sense of smell.  Loss of body hair.  Nausea and vomiting.  Problems caused by the production of too many hormones, such as: ? Inability to get pregnant after a year of having sex regularly without using birth control (infertility). ? Loss of menstrual periods in women. ? Abnormal  growth. ? Diabetes insipidus or diabetes mellitus. ? High blood pressure (hypertension). ? Inability to tolerate heat or cold. ? Increase in sweating. ? Joint pain. ? Other skin and body changes. ? Nipple discharge. ? Changes in mood, or depression. ? Decreased sexual function. How is this diagnosed? This condition may be diagnosed based on:  Your symptoms.  Your medical history.  Blood or urine tests to check your hormone levels.  CT scan.  MRI.  Removal and examination of a small tumor tissue (biopsy). How is this treated? Treatment depends on the type of pituitary tumor you have and your overall health. Treatments may include:  Surgical removal of the tumor.  Using high doses of X-ray energy to kill tumor cells (radiation).  Using certain medicines to stop the pituitary gland from producing too many hormones (drug therapy). If you have a family history of pituitary tumors, you may need to have regular blood tests to monitor pituitary hormone levels. Follow these instructions at home:  Drink enough fluid to keep your urine clear or pale yellow.  If directed, follow instructions from your health care provider about measuring how much urine you pass.  Do not pick your nose or remove any crusting, if your nose is draining clear fluid. Tell your health care provider if this condition worsens.  Do not do any activities that require straining, such as heavy lifting. Ask your health care provider what activities are safe for you.  Take over-the-counter and prescription medicines only as told by your health care provider.  Keep  all follow-up visits as told by your health care provider. This is important, especially if you have a family history of pituitary tumors and you need regular blood tests. Contact a health care provider if:  You have sudden, unusual thirst.  You are urinating more often than usual.  You have a headache that does not go away.  You develop new  changes in your vision.  You have clear fluid leaking from your nose or ears.  You have a sensation of fluid trickling down the back of your throat.  You have a salty taste in your mouth.  You have trouble concentrating. Get help right away if:  Your symptoms suddenly become severe.  You have a nosebleed that does not stop after a few minutes.  You have a fever of over 101F (38.3C).  You have a severe headache.  You have a stiff neck.  You are confused or not as alert as usual.  You have chest pain.  You have shortness of breath. Summary  Pituitary tumors are abnormal growths found in the pituitary gland.  Treatment depends on the type of pituitary tumor you have and your overall health.  Keep all follow-up visits as told by your health care provider. This is important, especially if you have a family history of pituitary tumors and you need regular blood tests. This information is not intended to replace advice given to you by your health care provider. Make sure you discuss any questions you have with your health care provider. Document Revised: 10/20/2018 Document Reviewed: 07/24/2016 Elsevier Patient Education  [place].

## 2019-11-26 NOTE — Progress Notes (Signed)
Patient ID: Edwin Rogers, male   DOB: 05-04-1971, 49 y.o.   MRN: [id number]   This visit occurred during the SARS-CoV-2 public health emergency.  Safety protocols were in place, including screening questions prior to the visit, additional usage of staff PPE, and extensive cleaning of exam room while observing appropriate contact time as indicated for disinfecting solutions.   HPI  Edwin Rogers is a 49 y.o.-year-old male, referred by his PCP, Dr. Anastasio Champion, for evaluation for hyperprolactinemia and pituitary adenoma.  Patient describes a history of fatigue, difficulty losing weight despite intermittent fasting - 16:8 hours, he also has HTN, OSA, and a slightly increased HbA1c of 5.8%.  He was diagnosed with a very low testosterone in 09/2019.   He was found to have a very high prolactin level in 10/2019 during investigation for hypogonadism.  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  TFTs were normal: Lab Results  Component Value Date   TSH 3.13 10/08/2019    Lab Results  Component Value Date   T3FREE 2.9 10/08/2019   She does not have a history of hypothyroidism.  No galactorrhea, but occasional breast tenderness and enlargement. No HAs or blurry/double vision. Has some numbness B face - sinusitis?.  She is not on Risperdal, Reglan. No Marijuana or other drugs.  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  Also, he has anemia.  He has an adopted 23 y/o son.  He is not interested in having more children.  ROS: Constitutional: no weight gain, no weight loss, + fatigue, no subjective hyperthermia, no subjective hypothermia, no nocturia Eyes: no blurry vision, no xerophthalmia ENT: no sore throat, no nodules felt in neck, no dysphagia, no odynophagia, no hoarseness, no tinnitus, no hypoacusis; + congestion Cardiovascular: no CP, no SOB, no palpitations, + leg swelling R>L Respiratory: no cough, no SOB, no wheezing Gastrointestinal: no N, no V, no D, + C, + acid reflux Musculoskeletal: no muscle, no joint aches Skin: no hair loss, + itching -psoriasis Neurological: no tremors, no numbness or tingling/no dizziness/no HAs Psychiatric: no depression, no anxiety + Decreased libido and difficulty with erections; + enlarged breasts  Past Medical History:  Diagnosis Date  . GERD (gastroesophageal reflux disease)   . Hypertension    pt states currently on no medications.  . Left groin hernia   . Sleep apnea    Diagnosed 2014-uses CPAP.   Past Surgical History:  Procedure Laterality Date  . ANKLE FRACTURE SURGERY     last year with a plate  . CHOLECYSTECTOMY  09/22/2015  . COLONOSCOPY N/A 05/27/2013   Procedure: COLONOSCOPY;  Surgeon: Rogene Houston, MD;  [facility];  Service: Endoscopy;  Laterality: N/A;  100  . COLONOSCOPY N/A 09/26/2016   Procedure: COLONOSCOPY;  Surgeon: Rogene Houston, MD;  [facility];  Service: Endoscopy;  Laterality: N/A;  1:00  . ORIF ANKLE FRACTURE  05/10/2012   Procedure: OPEN REDUCTION INTERNAL FIXATION (ORIF) ANKLE FRACTURE;  Surgeon: Marin Shutter, MD;  Location: [place];  Service: Orthopedics;  Laterality: Right;  . POLYPECTOMY  09/26/2016   Procedure: POLYPECTOMY;  Surgeon: Rogene Houston, MD;  [facility];  Service: Endoscopy;;  colon  . ROBOT ASSISTED LAPAROSCOPIC NEPHRECTOMY N/A 09/22/2015   Procedure: XI ROBOTIC  ASSISTED LAPAROSCOPIC LEFT RADIAL NEPHRECTOMY WITH EXTENSIVE LAPAROSCOPIC ADHESIOLYSIS ;  Surgeon: Alexis Frock, MD;  [facility];  Service: Urology;  Laterality: N/A;   Social History   Socioeconomic History  . Marital status: Married    Spouse name: Not on file  . Number of children: 1  . Years of education: Not on file  . Highest education level: Not on file  Occupational History  . Not on file  Tobacco Use  . Smoking status: Never Smoker  . Smokeless tobacco: Never Used  Vaping Use  . Vaping Use: Never used  Substance and Sexual Activity  . Alcohol use: No  . Drug use: No  . Sexual activity: Not on file  Other Topics Concern  . 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   Has an adopted son - 74 y/o in 2021   Social Determinants of Health   Financial Resource Strain:   . Difficulty of Paying Living Expenses:   Food Insecurity:   . Worried About Charity fundraiser in the Last Year:   . Arboriculturist in the Last Year:   Transportation Needs:   . Film/video editor (Medical):   Edwin Kitchen Lack of Transportation (Non-Medical):   Physical Activity:   . Days of Exercise per Week:   . Minutes of Exercise per Session:   Stress:   . Feeling of Stress :   Social Connections:   . Frequency of Communication with Friends and Family:   . Frequency of Social Gatherings with Friends and Family:   . Attends Religious Services:   . Active Member of Clubs or Organizations:   . Attends Archivist Meetings:   Edwin Kitchen Marital Status:   Intimate Partner Violence:   . Fear of Current or Ex-Partner:   . Emotionally Abused:   Edwin Kitchen Physically Abused:   . Sexually Abused:    Current Outpatient Medications on File Prior to Visit  Medication Sig Dispense Refill  . famotidine-calcium carbonate-magnesium hydroxide (PEPCID COMPLETE) [phone] MG CHEW chewable tablet Chew 1 tablet by  mouth at bedtime. Heartburn      No current facility-administered medications on file prior to visit.   Allergies  Allergen Reactions  . Other Nausea And Vomiting    ALL PAIN MEDS   Family History  Problem Relation Age of Onset  . Colon cancer Father   . Kidney disease Father   . Colon cancer Other   . Colon cancer Other   . Hypertension Mother     PE: BP 130/80   Pulse 64   Ht 6\' 2"  (1.88 m)   Wt (!) 361 lb (163.7 kg)   SpO2 98%   BMI 46.35 kg/m  Wt Readings from Last 3 Encounters:  11/26/19 (!) 361 lb (163.7 kg)  11/17/19 (!) 360 lb (163.3 kg)  10/08/19 (!) 363 lb (164.7 kg)   Constitutional: overweight, in NAD Eyes: PERRLA,  EOMI, no exophthalmos ENT: moist mucous membranes, no thyromegaly, no cervical lymphadenopathy Cardiovascular: RRR, No MRG, + B pitting edema R>L Respiratory: CTA B Gastrointestinal: abdomen soft, NT, ND, BS+ Musculoskeletal: no deformities, strength intact in all 4 Skin: moist, warm, no rashes Neurological: no tremor with outstretched hands, DTR normal in all 4  ASSESSMENT: 1.  Hyperprolactinemia Patient with new diagnosis of hyperprolactinemia.  High prolactin can be due to many factors, however, in his case, he has a large pituitary adenoma and this is most likely the origin of his hyperprolactinemia. - He did see neurosurgery yesterday (Dr. Kathyrn Sheriff).  He will also obtain a contrasted pituitary MRI now and another one in 6 months, but no surgical intervention was recommended - high prolactin most frequently impacts testosterone levels and fertility.  We did discuss that after treatment of prolactinoma, more than 70% of patients improve their testosterone over the next year - we will recheck prolactin today - We discussed about possible treatment if prolactin remains elevated.  We have 2 medical options for treatment: Cabergoline and bromocriptine.  Cabergoline has the advantage of being administered usually 1-2 times a week, is better  tolerated, and has beneficial effects on pituitary tumor size.  Bromocriptine is taken several times a day, has more side effects, and has no effect on pituitary tumor size.  He agrees with the plan to start cabergoline if still needed -if the prolactin level remains at the same level, will probably need 0.5 mg twice a week.  We discussed about possible side effects of cabergoline.  We also discussed about potential need for transsphenoidal surgery (discussed the 3 types) in case he cannot tolerate dopamine agonist.   -We discussed about possible occurrence of CSF rhinorrhea during treatment of such a large tumor.  I advised him that this is a neurosurgical emergency and in that case, he needs to contact Dr. Cleotilde Neer office right away. 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 I explained that, since the tumor is >1 cm, this qualifies as a macro-, rather than a micro-adenoma.  He does have an unusual shape, but this can be occasionally seen with prolactin secreting tumors. - I explained that this is not a "brain tumor". These tumors are extremely rarely malignant, the vast majority of them are benign.  - We discussed about the fact that the pituitary adenoma can be:  Not producing hormones, not compressing the pituitary gland or the optic chiasm  Not producing hormones, but compressing either the pituitary gland (causing hypopituitarism) or the optic chiasm (causing visual field cuts).  I did suggest that he sees an ophthalmologist for visual field.  Producing hormones: - Prolactin (prolactinoma) -usually manifests with high prolactin level and hypogonadism and occasional galactorrhea - ACTH (Cushing's disease) -usually  manifests with weight gain-with specific central distribution of fat, wide, purple, stretch marks, full supraclavicular fat pads, diabetes, hypertension. - growth hormone (acromegaly) - he denies an enlarged jaw, change in the size of rings or changing shoe sizes - LH or FSH (gonadotropin secreting tumor) -usually manifests with irregular menses in women, hyper/hypogonadism in men - TSH (TSH secreting tumor) (very rare) -but recent TFTs were normal -we will recheck these today - His prolactin level was elevated which points towards a prolactin  producing tumor, however, I would like to check the rest of her pituitary labs to make sure there is no concomitant hormonal hypersecretion or dysfunction - I ordered the following labs: Orders Placed This Encounter  Procedures  . Follicle stimulating hormone  . Luteinizing hormone  . Testosterone Free with SHBG  . Prolactin  . Insulin-like growth factor  . Growth hormone  . TSH  . T4, free  . T3, free  . Cortisol  . ACTH   3.  Hypogonadism -At today's visit, we will check a testosterone, LH, FSH -We will continue to follow this during treatment for hyperprolactinemia -May need testosterone replacement if testosterone does not recover completely after resolution of his hyperprolactinemia -he does not desire fertility  - Total time spent for the visit: 1 hour, in obtaining medical information from the chart, reviewing his  previous labs, imaging evaluations, and treatments, reviewing his symptoms, counseling him about his conditions (please see the discussed topics above), and developing a plan to further investigate and treat them; he had a number of questions which I addressed.  Component     Latest Ref Rng & Units 11/26/2019  Testosterone, Serum (Total)     ng/dL 12 (L)  % Free Testosterone     % 1.2  Free Testosterone, S     pg/mL 1.4 (L)  Sex Hormone Binding Globulin     nmol/L 31.1  IGF-I, LC/MS     52 - 328 ng/mL 88  Z-Score (Male)      -2.0 - 2 SD -1.0  Extra tube recieved        Specimen type recieved      Serum;  Triiodothyronine,Free,Serum     2.3 - 4.2 pg/mL 3.4  TSH     0.35 - 4.50 uIU/mL 4.62 (H)  Prolactin     2.0 - 18.0 ng/mL 758.2 (H)  FSH     1.4 - 18.1 mIU/ML 0.6 (L)  LH     1.50 - 9.30 mIU/mL 0.17 (L)  Growth Hormone     < OR = 7.1 ng/mL 0.1  T4,Free(Direct)     0.60 - 1.60 ng/dL 0.76  Cortisol, Plasma     ug/dL 10.5  C206 ACTH     6 - 50 pg/mL 81 (H)   Prolactin remains very high, with FSH, LH, and testosterone very low.  His IGF-I and growth hormone are normal.  His ACTH is slightly high while cortisol is normal.  TSH is also slightly high with normal free T4 and free T3.  For now, I would suggest to start cabergoline 0.25 mg twice a day and have him return for repeat prolactin level along with ACTH, cortisol, and TFTs in 1.5 months.  Philemon Kingdom, MD PhD [facility] Endocrinology

## 2019-12-01 ENCOUNTER — Telehealth: Payer: Self-pay

## 2019-12-01 LAB — TESTOSTERONE, FREE AND TOTAL (INCLUDES SHBG)-(MALES)
% Free Testosterone: 1.2 %
Free Testosterone, S: 1.4 pg/mL — ABNORMAL LOW
Sex Hormone Binding Globulin: 31.1 nmol/L
Testosterone, Serum (Total): 12 ng/dL — ABNORMAL LOW

## 2019-12-01 NOTE — Telephone Encounter (Signed)
LM for patient to CB °

## 2019-12-01 NOTE — Telephone Encounter (Signed)
Left detailed message on voicemail for patient to go pick up his MRI disk at [facility] per Dr Arman Filter request.   I called and it will be ready to pick up this afternoon or tomorrow.

## 2019-12-01 NOTE — Telephone Encounter (Signed)
-----   Message from Philemon Kingdom, MD sent at 11/30/2019 12:57 PM EDT ----- M, can you please obtain the  recent pituitary MRI for this pt on a CD to be ready to send his records to [facility] clinic)? I would like to send them the records before I leave.

## 2019-12-02 LAB — INSULIN-LIKE GROWTH FACTOR
IGF-I, LC/MS: 88 ng/mL (ref 52–328)
Z-Score (Male): -1 SD (ref ?–2.0)

## 2019-12-02 LAB — EXTRA SPECIMEN

## 2019-12-02 LAB — GROWTH HORMONE: Growth Hormone: 0.1 ng/mL (ref <=7.1)

## 2019-12-02 LAB — PROLACTIN: Prolactin: 758.2 ng/mL — ABNORMAL HIGH (ref 2.0–18.0)

## 2019-12-02 LAB — ACTH: C206 ACTH: 81 pg/mL — ABNORMAL HIGH (ref 6–50)

## 2019-12-03 MED ORDER — CABERGOLINE 0.5 MG PO TABS: 0.2500 mg | ORAL_TABLET | ORAL | 2 refills | Status: DC

## 2019-12-08 ENCOUNTER — Ambulatory Visit (HOSPITAL_COMMUNITY)
Admission: RE | Admit: 2019-12-08 | Discharge: 2019-12-08 | Disposition: A | Discharge status: Home / Self Care | Payer: PRIVATE HEALTH INSURANCE | Source: Ambulatory Visit | Attending: Urology | Admitting: Urology

## 2019-12-08 ENCOUNTER — Other Ambulatory Visit (HOSPITAL_COMMUNITY): Payer: Self-pay | Admitting: Urology

## 2019-12-08 ENCOUNTER — Other Ambulatory Visit: Payer: Self-pay

## 2019-12-08 DIAGNOSIS — C642 Malignant neoplasm of left kidney, except renal pelvis: Secondary | ICD-10-CM

## 2019-12-11 ENCOUNTER — Other Ambulatory Visit: Payer: Self-pay | Admitting: Neurosurgery

## 2019-12-11 ENCOUNTER — Other Ambulatory Visit (HOSPITAL_COMMUNITY): Payer: Self-pay | Admitting: Neurosurgery

## 2019-12-15 ENCOUNTER — Ambulatory Visit (INDEPENDENT_AMBULATORY_CARE_PROVIDER_SITE_OTHER): Payer: PRIVATE HEALTH INSURANCE | Admitting: Internal Medicine

## 2020-01-05 ENCOUNTER — Ambulatory Visit (INDEPENDENT_AMBULATORY_CARE_PROVIDER_SITE_OTHER): Payer: PRIVATE HEALTH INSURANCE | Admitting: Internal Medicine

## 2020-01-05 ENCOUNTER — Other Ambulatory Visit: Payer: Self-pay

## 2020-01-05 ENCOUNTER — Encounter (INDEPENDENT_AMBULATORY_CARE_PROVIDER_SITE_OTHER): Payer: Self-pay | Admitting: Internal Medicine

## 2020-01-05 VITALS — BP 130/70 | HR 63 | Temp 97.4°F | Resp 18 | Ht 74.0 in | Wt 356.0 lb

## 2020-01-05 DIAGNOSIS — E291 Testicular hypofunction: Secondary | ICD-10-CM | POA: Diagnosis not present

## 2020-01-05 DIAGNOSIS — D497 Neoplasm of unspecified behavior of endocrine glands and other parts of nervous system: Secondary | ICD-10-CM | POA: Diagnosis not present

## 2020-01-05 NOTE — Progress Notes (Signed)
Metrics: Intervention Frequency ACO  Documented Smoking Status Yearly  Screened one or more times in 24 months  Cessation Counseling or  Active cessation medication Past 24 months  Past 24 months   Guideline developer: UpToDate (See UpToDate for funding source) Date Released: 2014       Wellness Office Visit  Subjective:  Patient ID: Edwin Rogers, male    DOB: 10/23/1970  Age: 49 y.o. MRN: [id number]  CC: This man comes in for follow-up of morbid obesity, recently diagnosed pituitary tumor. HPI  He has seen neurosurgery and the neurosurgeon told him that resection of the tumor is probably the third choice.  He is going with chemical treatment to reduce the size of the tumor under the care of endocrinology, Dr. Renne Crigler.  She will follow up with him in about January of next year to reevaluate. In the meantime, he continues to work on diet and intermittent fasting usually 16 hours every day and he has managed to lose some weight.  He continues to eat animal protein most days of the week. Past Medical History:  Diagnosis Date  . GERD (gastroesophageal reflux disease)   . Hypertension    pt states currently on no medications.  . Left groin hernia   . Sleep apnea    Diagnosed 2014-uses CPAP.   Past Surgical History:  Procedure Laterality Date  . ANKLE FRACTURE SURGERY     last year with a plate  . CHOLECYSTECTOMY  09/22/2015  . COLONOSCOPY N/A 05/27/2013   Procedure: COLONOSCOPY;  Surgeon: Rogene Houston, MD;  [facility];  Service: Endoscopy;  Laterality: N/A;  100  . COLONOSCOPY N/A 09/26/2016   Procedure: COLONOSCOPY;  Surgeon: Rogene Houston, MD;  [facility];  Service: Endoscopy;  Laterality: N/A;  1:00  . ORIF ANKLE FRACTURE  05/10/2012   Procedure: OPEN REDUCTION INTERNAL FIXATION (ORIF) ANKLE FRACTURE;  Surgeon: Marin Shutter, MD;  Location: [place];  Service: Orthopedics;  Laterality: Right;  . POLYPECTOMY  09/26/2016   Procedure: POLYPECTOMY;   Surgeon: Rogene Houston, MD;  [facility];  Service: Endoscopy;;  colon  . ROBOT ASSISTED LAPAROSCOPIC NEPHRECTOMY N/A 09/22/2015   Procedure: XI ROBOTIC ASSISTED LAPAROSCOPIC LEFT RADIAL NEPHRECTOMY WITH EXTENSIVE LAPAROSCOPIC ADHESIOLYSIS ;  Surgeon: Alexis Frock, MD;  [facility];  Service: Urology;  Laterality: N/A;     Family History  Problem Relation Age of Onset  . Colon cancer Father   . Kidney disease Father   . Colon cancer Other   . Colon cancer Other   . 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   Has an adopted son - 37 y/o in 2021   Social History   Tobacco Use  . Smoking status: Never Smoker  . Smokeless tobacco: Never Used  Substance Use Topics  . Alcohol use: No    Current Meds  Medication Sig  . cabergoline (DOSTINEX) 0.5 MG tablet Take 0.25 mg by mouth 2 (two) times a week.  . famotidine-calcium carbonate-magnesium hydroxide (PEPCID COMPLETE) 10-800-165 MG CHEW chewable tablet Chew 1 tablet by mouth at bedtime. Heartburn   . Vitamin D, Ergocalciferol, (DRISDOL) 1.25 MG (50000 UNIT) CAPS capsule Take 50,000 Units by mouth once a week.     Depression screen [facility] 2/9 10/08/2019  Decreased Interest 0  Down, Depressed, Hopeless 0  PHQ -  2 Score 0     Objective:   Today's Vitals: BP 130/70 (BP Location: Right Arm, Patient Position: Sitting, Cuff Size: Normal)   Pulse 63   Temp (!) 97.4 F (36.3 C) (Temporal)   Resp 18   Ht 6\' 2"  (1.88 m)   Wt (!) 356 lb (161.5 kg)   SpO2 98%   BMI 45.71 kg/m  Vitals with BMI 01/05/2020 11/26/2019 11/17/2019  Height 6\' 2"  6\' 2"  6\' 2"   Weight 356 lbs 361 lbs 360 lbs  BMI 45.69 56.31 49.7  Systolic 026 378 588  Diastolic 70 80 90  Pulse 63 64 67     Physical Exam  He looks systemically well.  He has lost 5 pounds in the last few weeks but remains morbidly  obese.     Assessment   1. Pituitary tumor   2. Hypogonadism in male   3. Morbid obesity ([place])       Tests ordered No orders of the defined types were placed in this encounter.    Plan: 1. Today, I stressed the importance of a plant-based diet and I recommended eating beans every day and reducing the animal protein intake.  He will continue with intermittent fasting and if he can certainly go longer hours if he can tolerate it as long as he is well-hydrated and has salt intake. 2. Follow-up in 2 months to see how he is doing.   No orders of the defined types were placed in this encounter.   Doree Albee, MD

## 2020-01-11 ENCOUNTER — Ambulatory Visit (INDEPENDENT_AMBULATORY_CARE_PROVIDER_SITE_OTHER): Payer: PRIVATE HEALTH INSURANCE | Admitting: Internal Medicine

## 2020-03-07 ENCOUNTER — Encounter (INDEPENDENT_AMBULATORY_CARE_PROVIDER_SITE_OTHER): Payer: Self-pay | Admitting: Internal Medicine

## 2020-03-07 ENCOUNTER — Other Ambulatory Visit: Payer: Self-pay

## 2020-03-07 ENCOUNTER — Ambulatory Visit (INDEPENDENT_AMBULATORY_CARE_PROVIDER_SITE_OTHER): Payer: PRIVATE HEALTH INSURANCE | Admitting: Internal Medicine

## 2020-03-07 DIAGNOSIS — E291 Testicular hypofunction: Secondary | ICD-10-CM

## 2020-03-07 DIAGNOSIS — E559 Vitamin D deficiency, unspecified: Secondary | ICD-10-CM | POA: Diagnosis not present

## 2020-03-07 DIAGNOSIS — D497 Neoplasm of unspecified behavior of endocrine glands and other parts of nervous system: Secondary | ICD-10-CM | POA: Diagnosis not present

## 2020-03-07 NOTE — Progress Notes (Signed)
Metrics: Intervention Frequency ACO  Documented Smoking Status Yearly  Screened one or more times in 24 months  Cessation Counseling or  Active cessation medication Past 24 months  Past 24 months   Guideline developer: UpToDate (See UpToDate for funding source) Date Released: 2014       Wellness Office Visit  Subjective:  Patient ID: Edwin Rogers, male    DOB: 1970/11/04  Age: 49 y.o. MRN: [id number]  CC: This man comes in for follow-up of morbid obesity, pituitary tumor and hypogonadism resulting from it as well as vitamin D deficiency. HPI  Since last time I saw him, he has done very well with intermittent fasting and eating healthier.  He has cut out all soda drinks and only drinks water now.  As a result, he continues to lose weight. He is also walking on a more regular basis as a form of exercise. He is following up with endocrinology and will repeat MRI scan in due course. His testosterone and thyroid function will also be tested again. He denies any new visual symptoms.  He denies any constant headaches. Past Medical History:  Diagnosis Date  . GERD (gastroesophageal reflux disease)   . Hypertension    pt states currently on no medications.  . Left groin hernia   . Sleep apnea    Diagnosed 2014-uses CPAP.   Past Surgical History:  Procedure Laterality Date  . ANKLE FRACTURE SURGERY     last year with a plate  . CHOLECYSTECTOMY  09/22/2015  . COLONOSCOPY N/A 05/27/2013   Procedure: COLONOSCOPY;  Surgeon: Rogene Houston, MD;  [facility];  Service: Endoscopy;  Laterality: N/A;  100  . COLONOSCOPY N/A 09/26/2016   Procedure: COLONOSCOPY;  Surgeon: Rogene Houston, MD;  [facility];  Service: Endoscopy;  Laterality: N/A;  1:00  . ORIF ANKLE FRACTURE  05/10/2012   Procedure: OPEN REDUCTION INTERNAL FIXATION (ORIF) ANKLE FRACTURE;  Surgeon: Marin Shutter, MD;  Location: [place];  Service: Orthopedics;  Laterality: Right;  . POLYPECTOMY  09/26/2016    Procedure: POLYPECTOMY;  Surgeon: Rogene Houston, MD;  [facility];  Service: Endoscopy;;  colon  . ROBOT ASSISTED LAPAROSCOPIC NEPHRECTOMY N/A 09/22/2015   Procedure: XI ROBOTIC ASSISTED LAPAROSCOPIC LEFT RADIAL NEPHRECTOMY WITH EXTENSIVE LAPAROSCOPIC ADHESIOLYSIS ;  Surgeon: Alexis Frock, MD;  [facility];  Service: Urology;  Laterality: N/A;     Family History  Problem Relation Age of Onset  . Colon cancer Father   . Kidney disease Father   . Colon cancer Other   . Colon cancer Other   . 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   Has an adopted son - 16 y/o in 2021   Social History   Tobacco Use  . Smoking status: Never Smoker  . Smokeless tobacco: Never Used  Substance Use Topics  . Alcohol use: No    Current Meds  Medication Sig  . cabergoline (DOSTINEX) 0.5 MG tablet Take 0.25 mg by mouth 2 (two) times a week.  . famotidine-calcium carbonate-magnesium hydroxide (PEPCID COMPLETE) 10-800-165 MG CHEW chewable tablet Chew 1 tablet by mouth at bedtime. Heartburn   . tiZANidine (ZANAFLEX) 4 MG tablet Take 4 mg by mouth 3 (three) times daily.      Depression screen [facility] 2/9 10/08/2019  Decreased Interest 0  Down, Depressed, Hopeless 0  PHQ - 2 Score 0     Objective:   Today's Vitals: BP 128/80   Pulse 61   Temp (!) 97.2 F (36.2 C) (Temporal)   Ht 6\' 2"  (1.88 m)   Wt (!) 345 lb 9.6 oz (156.8 kg)   SpO2 98%   BMI 44.37 kg/m  Vitals with BMI 03/07/2020 01/05/2020 11/26/2019  Height 6\' 2"  6\' 2"  6\' 2"   Weight 345 lbs 10 oz 356 lbs 361 lbs  BMI 44.35 02.40 97.35  Systolic 329 924 268  Diastolic 80 70 80  Pulse 61 63 64     Physical Exam  Although he remains morbidly obese, he has lost a further 11 pounds since the last time he was seen here.     Assessment   1. Morbid obesity ([place])   2. Hypogonadism in  male   3. Pituitary tumor   4. Vitamin D deficiency disease       Tests ordered No orders of the defined types were placed in this encounter.    Plan: 1. He will continue to focus on nutrition with intermittent fasting and more of a plant-based diet. 2. He will continue with vitamin D3 supplementation for vitamin D deficiency. 3. Follow-up in 6 months.   No orders of the defined types were placed in this encounter.   Doree Albee, MD

## 2020-05-27 ENCOUNTER — Other Ambulatory Visit (HOSPITAL_COMMUNITY): Payer: Self-pay | Admitting: Neurosurgery

## 2020-05-27 ENCOUNTER — Other Ambulatory Visit: Payer: Self-pay | Admitting: Neurosurgery

## 2020-05-27 DIAGNOSIS — D352 Benign neoplasm of pituitary gland: Secondary | ICD-10-CM

## 2020-06-02 ENCOUNTER — Encounter: Payer: Self-pay | Admitting: Internal Medicine

## 2020-06-02 ENCOUNTER — Ambulatory Visit (INDEPENDENT_AMBULATORY_CARE_PROVIDER_SITE_OTHER): Payer: PRIVATE HEALTH INSURANCE | Admitting: Internal Medicine

## 2020-06-02 ENCOUNTER — Other Ambulatory Visit: Payer: Self-pay

## 2020-06-02 VITALS — BP 110/82 | HR 60 | Ht 74.0 in | Wt 347.2 lb

## 2020-06-02 DIAGNOSIS — D497 Neoplasm of unspecified behavior of endocrine glands and other parts of nervous system: Secondary | ICD-10-CM

## 2020-06-02 DIAGNOSIS — R7989 Other specified abnormal findings of blood chemistry: Secondary | ICD-10-CM | POA: Diagnosis not present

## 2020-06-02 DIAGNOSIS — E291 Testicular hypofunction: Secondary | ICD-10-CM

## 2020-06-02 LAB — TSH: TSH: 2.71 u[IU]/mL (ref 0.35–4.50)

## 2020-06-02 LAB — T4, FREE: Free T4: 0.76 ng/dL (ref 0.60–1.60)

## 2020-06-02 LAB — T3, FREE: T3, Free: 3.6 pg/mL (ref 2.3–4.2)

## 2020-06-02 LAB — CORTISOL: Cortisol, Plasma: 8.8 ug/dL

## 2020-06-02 MED ORDER — CABERGOLINE 0.5 MG PO TABS
0.2500 mg | ORAL_TABLET | ORAL | 3 refills | Status: DC
Start: 2020-06-02 — End: 2020-06-08

## 2020-06-02 NOTE — Progress Notes (Signed)
Patient ID: Edwin Rogers, male   DOB: 29-Oct-1970, 50 y.o.   MRN: [id number]   This visit occurred during the SARS-CoV-2 public health emergency.  Safety protocols were in place, including screening questions prior to the visit, additional usage of staff PPE, and extensive cleaning of exam room while observing appropriate contact time as indicated for disinfecting solutions.   HPI  Edwin Rogers is a 50 y.o.-year-old male, initially referred by his PCP, Dr. Anastasio Champion, returning for follow-up for hyperprolactinemia and pituitary adenoma. Last visit 6 months ago.  Since last visit, he had done very well with intermittent fasting, losing weight. He feels better.  Reviewed and addended history: Patient described a history of fatigue, difficulty losing weight despite intermittent fasting - 16:8 hours, he also has HTN, OSA, and a slightly increased HbA1c of 5.8%.  He was found to have a very low testosterone in 09/2019.   He was found to have a very high prolactin level in 10/2019 during investigation for hypogonadism.  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 Sabra Heck): normal  He has another pituitary MRI pending later this month.  Reviewed prolactin le/vels: Lab Results  Component Value Date   PROLACTIN 758.2 (H) 11/26/2019   PROLACTIN 683.1 (H) 11/17/2019   At last visit, we investigated the rest of his pituitary hormones: His prolactin level remains high, FSH, LH, and testosterone levels were low, ACTH was slightly high and cortisol normal. IGF-I and growth hormone were also normal. Component     Latest Ref Rng & Units 11/26/2019  Testosterone, Serum (Total)     ng/dL 12 (L)  % Free Testosterone     % 1.2  Free Testosterone, S     pg/mL 1.4 (L)  Sex Hormone Binding Globulin     nmol/L 31.1  IGF-I, LC/MS     52 - 328 ng/mL 88   Z-Score (Male)     -2.0 - 2 SD -1.0  Extra tube recieved        Specimen type recieved      Serum;  Triiodothyronine,Free,Serum     2.3 - 4.2 pg/mL 3.4  TSH     0.35 - 4.50 uIU/mL 4.62 (H)  Prolactin     2.0 - 18.0 ng/mL 758.2 (H)  FSH     1.4 - 18.1 mIU/ML 0.6 (L)  LH     1.50 - 9.30 mIU/mL 0.17 (L)  Growth Hormone     < OR = 7.1 ng/mL 0.1  T4,Free(Direct)     0.60 - 1.60 ng/dL 0.76  Cortisol, Plasma     ug/dL 10.5  C206 ACTH     6 - 50 pg/mL 81 (H)   Reviewed testosterone levels: Component     Latest Ref Rng & Units 10/08/2019  Testosterone     250 - 827 ng/dL 18 (L)  Albumin MSPROF     3.6 - 5.1 g/dL 4.0  Sex Horm Binding Glob, Serum     10 - 50 nmol/L 28  Testosterone Free     46.0 - 224.0 pg/mL See below  Of note, ferritin and iron saturation were normal 10/2019.  Previous TFTs were normal: Lab Results  Component Value Date   TSH 4.62 (H) 11/26/2019   TSH 3.13 10/08/2019   FREET4 0.76 11/26/2019    Lab Results  Component Value Date   FREET4 0.76 11/26/2019   T3FREE  3.4 11/26/2019   T3FREE 2.9 10/08/2019   No galactorrhea, but had occasional breast tenderness (not bothering him anymore) and enlargement.  No headaches or blurry/double vision.  Not on Risperdal, Reglan, not using any drugs including marijuana.  At last visit, I suggested to start Cabergoline 0.25 mg twice a week and return for labs in 2 months. He did not come back for these...  He tolerates the Cabergoline well, without any side effects. He did not notice any improvement in morning erections after starting Cabergoline.  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  Also, he has anemia.  He has an adopted 61 y/o son. He is not interested in having more children.  ROS: Constitutional: no weight gain/+ intentional weight loss, no fatigue, no subjective hyperthermia, no subjective hypothermia Eyes: no blurry vision, no xerophthalmia ENT: no sore throat, no  nodules palpated in neck, no dysphagia, no odynophagia, no hoarseness Cardiovascular: no CP/no SOB/no palpitations/+ leg swelling Respiratory: no cough/no SOB/no wheezing Gastrointestinal: no N/no V/no D/+ C/+ acid reflux Musculoskeletal: no muscle aches/no joint aches Skin: no rashes, no hair loss, + itching-psoriasis Neurological: no tremors/no numbness/no tingling/no dizziness + enlarged breasts  I reviewed pt's medications, allergies, PMH, social hx, family hx, and changes were documented in the history of present illness. Otherwise, unchanged from my initial visit note.  Past Medical History:  Diagnosis Date  . GERD (gastroesophageal reflux disease)   . Hypertension    pt states currently on no medications.  . Left groin hernia   . Sleep apnea    Diagnosed 2014-uses CPAP.   Past Surgical History:  Procedure Laterality Date  . ANKLE FRACTURE SURGERY     last year with a plate  . CHOLECYSTECTOMY  09/22/2015  . COLONOSCOPY N/A 05/27/2013   Procedure: COLONOSCOPY;  Surgeon: Rogene Houston, MD;  [facility];  Service: Endoscopy;  Laterality: N/A;  100  . COLONOSCOPY N/A 09/26/2016   Procedure: COLONOSCOPY;  Surgeon: Rogene Houston, MD;  [facility];  Service: Endoscopy;  Laterality: N/A;  1:00  . ORIF ANKLE FRACTURE  05/10/2012   Procedure: OPEN REDUCTION INTERNAL FIXATION (ORIF) ANKLE FRACTURE;  Surgeon: Marin Shutter, MD;  Location: [place];  Service: Orthopedics;  Laterality: Right;  . POLYPECTOMY  09/26/2016   Procedure: POLYPECTOMY;  Surgeon: Rogene Houston, MD;  [facility];  Service: Endoscopy;;  colon  . ROBOT ASSISTED LAPAROSCOPIC NEPHRECTOMY N/A 09/22/2015   Procedure: XI ROBOTIC ASSISTED LAPAROSCOPIC LEFT RADIAL NEPHRECTOMY WITH EXTENSIVE LAPAROSCOPIC ADHESIOLYSIS ;  Surgeon: Alexis Frock, MD;  [facility];  Service: Urology;  Laterality: N/A;   Social History   Socioeconomic History  . Marital status: Married    Spouse name:  Not on file  . Number of children: 1  . Years of education: Not on file  . Highest education level: Not on file  Occupational History  . Not on file  Tobacco Use  . Smoking status: Never Smoker  . Smokeless tobacco: Never Used  Vaping Use  . Vaping Use: Never used  Substance and Sexual Activity  . Alcohol use: No  . Drug use: No  . Sexual activity: Not on file  Other Topics Concern  . 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   Has an adopted son - 75 y/o in 2021   Social Determinants of  Health   Financial Resource Strain: Not on file  Food Insecurity: Not on file  Transportation Needs: Not on file  Physical Activity: Not on file  Stress: Not on file  Social Connections: Not on file  Intimate Partner Violence: Not on file   Current Outpatient Medications on File Prior to Visit  Medication Sig Dispense Refill  . cabergoline (DOSTINEX) 0.5 MG tablet Take 0.25 mg by mouth 2 (two) times a week.    . famotidine-calcium carbonate-magnesium hydroxide (PEPCID COMPLETE) 10-800-165 MG CHEW chewable tablet Chew 1 tablet by mouth at bedtime. Heartburn     . tiZANidine (ZANAFLEX) 4 MG tablet Take 4 mg by mouth 3 (three) times daily.    . Vitamin D, Ergocalciferol, (DRISDOL) 1.25 MG (50000 UNIT) CAPS capsule Take 50,000 Units by mouth once a week. (Patient not taking: Reported on 03/07/2020)     No current facility-administered medications on file prior to visit.   Allergies  Allergen Reactions  . Other Nausea And Vomiting    ALL PAIN MEDS   Family History  Problem Relation Age of Onset  . Colon cancer Father   . Kidney disease Father   . Colon cancer Other   . Colon cancer Other   . Hypertension Mother     PE: BP 110/82   Pulse 60   Ht 6\' 2"  (1.88 m)   Wt (!) 347 lb 3.2 oz (157.5 kg)   SpO2 96%   BMI 44.58 kg/m  Wt Readings from Last 3 Encounters:  06/02/20 (!) 347 lb  3.2 oz (157.5 kg)  03/07/20 (!) 345 lb 9.6 oz (156.8 kg)  01/05/20 (!) 356 lb (161.5 kg)   Constitutional: overweight, in NAD Eyes: PERRLA, EOMI, no exophthalmos ENT: moist mucous membranes, no thyromegaly, no cervical lymphadenopathy Cardiovascular: RRR, No MRG, + B pitting edema R>L Respiratory: CTA B Gastrointestinal: abdomen soft, NT, ND, BS+ Musculoskeletal: no deformities, strength intact in all 4 Skin: moist, warm, no rashes Neurological: no tremor with outstretched hands, DTR normal in all 4  ASSESSMENT and PLAN: 1.  Hyperprolactinemia -Patient with new dipatient with history of hyperprolactinemia, most likely related to his pituitary adenoma. -He has hypogonadotropic hypogonadism as a complication -At last visit, we discussed about options for treatment and we decided to start Cabergoline 0.25 mg twice a week -I did advise him to come back for labs in 2 months after starting Cabergoline but he did not do so -At this visit, he is taking this consistently and has not seen any side effects: No rhinorrhea (I did advise him that this may be a sign of CSF leak in the setting of tumor shrinkage), no congestion, no headaches, no nausea/vomiting, no orthostasis. -At today's visit we will repeat his prolactin level.  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 He does have an unusual shape, but this can be occasionally seen with prolactin secreting tumors. -At last visit, we checked the rest of his pituitary hormones and his TSH was slightly high, as was his ACTH, however, cortisol level was normal. We discussed about the fact that we need to recheck these after starting Cabergoline. -  today we will check: Prolactin, ACTH, cortisol, TSH, free T4, free T3  3.  Hypogonadotropic  hypogonadism -At last visit, we checked a testosterone, LH, FSH and they were low, consistent with hyperprolactinemia induced hypogonadotropic  hypogonadism -Restarted Cabergoline at last visit and we discussed that more than 70% of patients normalized her testosterone levels 1 year after normalization of prolactin.  -We will recheck his testosterone when prolactin level normalized -At this visit, he tells me that his morning erections improved after starting Cabergoline. This is a very good sign. -However, he may need testosterone replacement if the testosterone does not recover completely after resolution of his hyperprolactinemia -He is not interested in fertility  Needs refills.  New MRI pending 06/09/2020.  Component     Latest Ref Rng & Units 11/17/2019 11/26/2019 06/02/2020  Triiodothyronine,Free,Serum     2.3 - 4.2 pg/mL  3.4 3.6  TSH     0.35 - 4.50 uIU/mL  4.62 (H) 2.71  Prolactin     2.0 - 18.0 ng/mL 683.1 (H) 758.2 (H) 30.0 (H)  T4,Free(Direct)     0.60 - 1.60 ng/dL  0.76 0.76  Cortisol, Plasma     ug/dL  10.5 8.8  C206 ACTH     6 - 50 pg/mL  81 (H) 77 (H)   Significant improvement of prolactin levels.  For now, I would suggest to continue the same dose of Cabergoline and plan to decrease the dose at next visit. TSH normalized. ACTH improved, but still above target, with a normal cortisol.  We will continue to follow it for now.  If still elevated at next visit, will check a dexamethasone suppression test.  Philemon Kingdom, MD PhD [facility] Endocrinology

## 2020-06-02 NOTE — Patient Instructions (Signed)
Please stop at the lab.  Continue cabergoline 0.25 mg 2x a week.  Please come back for a follow-up appointment in 6 months.

## 2020-06-07 LAB — ACTH: C206 ACTH: 77 pg/mL — ABNORMAL HIGH (ref 6–50)

## 2020-06-07 LAB — PROLACTIN: Prolactin: 30 ng/mL — ABNORMAL HIGH (ref 2.0–18.0)

## 2020-06-08 ENCOUNTER — Encounter: Payer: Self-pay | Admitting: Internal Medicine

## 2020-06-08 MED ORDER — CABERGOLINE 0.5 MG PO TABS: 0.2500 mg | ORAL_TABLET | ORAL | 3 refills | Status: DC

## 2020-06-09 ENCOUNTER — Other Ambulatory Visit: Payer: Self-pay

## 2020-06-09 ENCOUNTER — Ambulatory Visit (HOSPITAL_COMMUNITY)
Admission: RE | Admit: 2020-06-09 | Discharge: 2020-06-09 | Disposition: A | Discharge status: Home / Self Care | Payer: PRIVATE HEALTH INSURANCE | Source: Ambulatory Visit | Attending: Neurosurgery | Admitting: Neurosurgery

## 2020-06-09 DIAGNOSIS — D352 Benign neoplasm of pituitary gland: Secondary | ICD-10-CM | POA: Insufficient documentation

## 2020-06-09 MED ORDER — GADOBUTROL 1 MMOL/ML IV SOLN
10.0000 mL | Freq: Once | INTRAVENOUS | Status: AC | PRN
  Administered 2020-06-09: 10 mL via INTRAVENOUS

## 2020-08-03 ENCOUNTER — Other Ambulatory Visit (HOSPITAL_COMMUNITY): Payer: Self-pay | Admitting: Internal Medicine

## 2020-08-31 ENCOUNTER — Other Ambulatory Visit (HOSPITAL_COMMUNITY): Payer: Self-pay

## 2020-08-31 MED ORDER — HYDROCOD POLST-CPM POLST ER 10-8 MG/5ML PO SUER
5.0000 mL | Freq: Two times a day (BID) | ORAL | 0 refills | Status: DC
  Filled 2020-08-31 – 2020-09-05 (×2): qty 120, 12d supply, fill #0

## 2020-08-31 MED ORDER — DOXYCYCLINE HYCLATE 100 MG PO CAPS
100.0000 mg | ORAL_CAPSULE | Freq: Two times a day (BID) | ORAL | 0 refills | Status: DC
  Filled 2020-08-31: qty 20, 10d supply, fill #0

## 2020-09-05 ENCOUNTER — Other Ambulatory Visit (HOSPITAL_COMMUNITY): Payer: Self-pay

## 2020-09-06 ENCOUNTER — Ambulatory Visit (INDEPENDENT_AMBULATORY_CARE_PROVIDER_SITE_OTHER): Payer: PRIVATE HEALTH INSURANCE | Admitting: Internal Medicine

## 2020-09-28 ENCOUNTER — Ambulatory Visit (INDEPENDENT_AMBULATORY_CARE_PROVIDER_SITE_OTHER): Payer: PRIVATE HEALTH INSURANCE | Admitting: Internal Medicine

## 2020-12-02 ENCOUNTER — Ambulatory Visit: Payer: PRIVATE HEALTH INSURANCE | Admitting: Internal Medicine

## 2020-12-02 NOTE — Progress Notes (Deleted)
Patient ID: Edwin Rogers, male   DOB: 1970-06-03, 50 y.o.   MRN: [id number]   This visit occurred during the SARS-CoV-2 public health emergency.  Safety protocols were in place, including screening questions prior to the visit, additional usage of staff PPE, and extensive cleaning of exam room while observing appropriate contact time as indicated for disinfecting solutions.   HPI  Edwin Rogers is a 50 y.o.-year-old male, initially referred by his PCP, Dr. Anastasio Champion, returning for follow-up for hyperprolactinemia and pituitary adenoma. Last visit 6 months ago.  Interim history Before last visit, he had done very well with intermittent fasting, losing weight.  He felt better.  Reviewed and addended history: Patient described a history of fatigue, difficulty losing weight despite intermittent fasting - 16:8 hours, he also has HTN, OSA, and a slightly increased HbA1c of 5.8%.   He was found to have a very low testosterone in 09/2019.  He was found to have a very high prolactin level in 10/2019 during investigation for hypogonadism. 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 Sabra Heck): normal  MRI (06/09/2020): Tumor not significantly changed  Reviewed prolactin le/vels: Lab Results  Component Value Date   PROLACTIN 30.0 (H) 06/02/2020   PROLACTIN 758.2 (H) 11/26/2019   PROLACTIN 683.1 (H) 11/17/2019   At last visit, we investigated the rest of his pituitary hormones: His prolactin level remains high, FSH, LH, and testosterone levels were low, ACTH was slightly high and cortisol normal. IGF-I and growth hormone were also normal.  Component     Latest Ref Rng & Units 11/26/2019 06/02/2020  Cortisol, Plasma     ug/dL 10.5 8.8  C206 ACTH     6 - 50 pg/mL 81 (H) 77 (H)   Component     Latest Ref Rng & Units 11/26/2019  Testosterone, Serum  (Total)     ng/dL 12 (L)  % Free Testosterone     % 1.2  Free Testosterone, S     pg/mL 1.4 (L)  Sex Hormone Binding Globulin     nmol/L 31.1  IGF-I, LC/MS     52 - 328 ng/mL 88  Z-Score (Male)     -2.0 - 2 SD -1.0  Extra tube recieved        Specimen type recieved      Serum;  Triiodothyronine,Free,Serum     2.3 - 4.2 pg/mL 3.4  TSH     0.35 - 4.50 uIU/mL 4.62 (H)  Prolactin     2.0 - 18.0 ng/mL 758.2 (H)  FSH     1.4 - 18.1 mIU/ML 0.6 (L)  LH     1.50 - 9.30 mIU/mL 0.17 (L)  Growth Hormone     < OR = 7.1 ng/mL 0.1  T4,Free(Direct)     0.60 - 1.60 ng/dL 0.76  Cortisol, Plasma     ug/dL 10.5  C206 ACTH     6 - 50 pg/mL 81 (H)   Reviewed testosterone levels: Component     Latest Ref Rng & Units 10/08/2019  Testosterone     250 - 827 ng/dL 18 (L)  Albumin MSPROF     3.6 - 5.1 g/dL 4.0  Sex Horm Binding Glob, Serum     10 - 50 nmol/L 28  Testosterone Free     46.0 - 224.0 pg/mL See below  Of note, ferritin and iron saturation were normal  10/2019.  Previous TFTs were normal except for a slightly elevated TSH, which normalized: Lab Results  Component Value Date   TSH 2.71 06/02/2020   TSH 4.62 (H) 11/26/2019   TSH 3.13 10/08/2019   FREET4 0.76 06/02/2020   FREET4 0.76 11/26/2019    Lab Results  Component Value Date   FREET4 0.76 06/02/2020   FREET4 0.76 11/26/2019   T3FREE 3.6 06/02/2020   T3FREE 3.4 11/26/2019   T3FREE 2.9 10/08/2019   No galactorrhea, but had occasional breast tenderness (not bothering him anymore) and enlargement. No headaches or blurry/double vision.  Not on Risperdal, Reglan, not using any drugs including marijuana.  We started Cabergoline 0.25 mg twice a week.  He continues on this dose He tolerates the Cabergoline well, without any side effects. He did not notice any improvement in morning erections after starting Cabergoline.  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  Also, he has anemia.  He has  an adopted 37 y/o son. He is not interested in having more children.  ROS: Constitutional: no weight gain/+ intentional weight loss, no fatigue, no subjective hyperthermia, no subjective hypothermia Eyes: no blurry vision, no xerophthalmia ENT: no sore throat, no nodules palpated in neck, no dysphagia, no odynophagia, no hoarseness Cardiovascular: no CP/no SOB/no palpitations/+ leg swelling Respiratory: no cough/no SOB/no wheezing Gastrointestinal: no N/no V/no D/+ C/+ acid reflux Musculoskeletal: no muscle aches/no joint aches Skin: no rashes, no hair loss, + itching-psoriasis Neurological: no tremors/no numbness/no tingling/no dizziness + enlarged breasts  I reviewed pt's medications, allergies, PMH, social hx, family hx, and changes were documented in the history of present illness. Otherwise, unchanged from my initial visit note.  Past Medical History:  Diagnosis Date   GERD (gastroesophageal reflux disease)    Hypertension    pt states currently on no medications.   Left groin hernia    Sleep apnea    Diagnosed 2014-uses CPAP.   Past Surgical History:  Procedure Laterality Date   ANKLE FRACTURE SURGERY     last year with a plate   CHOLECYSTECTOMY  09/22/2015   COLONOSCOPY N/A 05/27/2013   Procedure: COLONOSCOPY;  Surgeon: Rogene Houston, MD;  [facility];  Service: Endoscopy;  Laterality: N/A;  100   COLONOSCOPY N/A 09/26/2016   Procedure: COLONOSCOPY;  Surgeon: Rogene Houston, MD;  [facility];  Service: Endoscopy;  Laterality: N/A;  1:00   ORIF ANKLE FRACTURE  05/10/2012   Procedure: OPEN REDUCTION INTERNAL FIXATION (ORIF) ANKLE FRACTURE;  Surgeon: Marin Shutter, MD;  Location: [place];  Service: Orthopedics;  Laterality: Right;   POLYPECTOMY  09/26/2016   Procedure: POLYPECTOMY;  Surgeon: Rogene Houston, MD;  [facility];  Service: Endoscopy;;  colon   ROBOT ASSISTED LAPAROSCOPIC NEPHRECTOMY N/A 09/22/2015   Procedure: XI ROBOTIC ASSISTED  LAPAROSCOPIC LEFT RADIAL NEPHRECTOMY WITH EXTENSIVE LAPAROSCOPIC ADHESIOLYSIS ;  Surgeon: Alexis Frock, MD;  Location: [facility];  Service: Urology;  Laterality: N/A;   Social History   Socioeconomic History   Marital status: Married    Spouse name: Not on file   Number of children: 1   Years of education: Not on file   Highest education level: Not on file  Occupational History   Not on file  Tobacco Use   Smoking status: Never   Smokeless tobacco: Never  Vaping Use   Vaping Use: Never used  Substance and Sexual Activity   Alcohol use: No  Drug use: No   Sexual activity: Not on file  Other Topics Concern   Not on file  Social History Narrative   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   Has an adopted son - 47 y/o in 2021   Social Determinants of Health   Financial Resource Strain: Not on file  Food Insecurity: Not on file  Transportation Needs: Not on file  Physical Activity: Not on file  Stress: Not on file  Social Connections: Not on file  Intimate Partner Violence: Not on file   Current Outpatient Medications on File Prior to Visit  Medication Sig Dispense Refill   cabergoline (DOSTINEX) 0.5 MG tablet Take 0.5 tablets (0.25 mg total) by mouth 2 (two) times a week. 10 tablet 3   chlorpheniramine-HYDROcodone (TUSSIONEX) 10-8 MG/5ML SUER TAKE 5 MLS BY MOUTH EVERY 12 HOURS 120 mL 0   famotidine-calcium carbonate-magnesium hydroxide (PEPCID COMPLETE) 10-800-165 MG CHEW chewable tablet Chew 1 tablet by mouth at bedtime. Heartburn     tiZANidine (ZANAFLEX) 4 MG tablet Take 4 mg by mouth 3 (three) times daily.     No current facility-administered medications on file prior to visit.   Allergies  Allergen Reactions   Other Nausea And Vomiting    ALL PAIN MEDS   Family History  Problem Relation Age of Onset   Colon cancer Father    Kidney disease Father    Colon cancer Other    Colon cancer Other     Hypertension Mother    PE: There were no vitals taken for this visit. Wt Readings from Last 3 Encounters:  06/02/20 (!) 347 lb 3.2 oz (157.5 kg)  03/07/20 (!) 345 lb 9.6 oz (156.8 kg)  01/05/20 (!) 356 lb (161.5 kg)   Constitutional: overweight, in NAD Eyes: PERRLA, EOMI, no exophthalmos ENT: moist mucous membranes, no thyromegaly, no cervical lymphadenopathy Cardiovascular: RRR, No MRG, + B pitting edema R>L Respiratory: CTA B Gastrointestinal: abdomen soft, NT, ND, BS+ Musculoskeletal: no deformities, strength intact in all 4 Skin: moist, warm, no rashes Neurological: no tremor with outstretched hands, DTR normal in all 4  ASSESSMENT and PLAN: 1.  Hyperprolactinemia -Patient with new dipatient with history of hyperprolactinemia, most likely related to his pituitary adenoma. -He has hypogonadotropic hypogonadism as a complication -we started Cabergoline 0.25 mg twice a week -He has no side effects from Cabergoline: No congestion, rhinorrhea, headaches, nausea, vomiting, orthostasis-We will recheck his prolactin level today -On the above dose, his prolactin level improved significantly with the latest level only slightly above target, at 30.  We did not change the regimen at that time -For now, I advised him to continue the same dose of Cabergoline but after results today, we will most likely be able to decrease the dose -We reviewed together his latest MRI report after last visit and the tumor did not significantly change in size.  We discussed that if it is not decreasing, he may need to have surgery.  We discussed about different types of surgery and the fact that transsphenoidal surgery with intranasal approach is the most common procedure.  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  Latest was on 06/09/2020.  The tumor remains larger than 1  cm, and not much changed on the last MRI.  It has an unusual shape, but this can be occasionally seen with prolactin secreting tumors -  His pituitary hormones were normal in the past except for slightly high TSH, normalized at last check, and a slightly high ACTH improved at last check.  Cortisol level was normal. -At today's visit we will check prolactin, ACTH, cortisol, TSH, free T4, free T3  3.  Hypogonadotropic hypogonadism -Patient's testosterone, LH, FSH were all low, consistent with hypogonadotropic hypogonadism, most likely induced by hyperprolactinemia -After starting Cabergoline, his prolactin decreased significantly and we discussed that more than 70% of patients normalized their testosterone levels 1 year after normalization of prolactin -At today's visit, we will recheck his prolactin and we will also check a testosterone level -He does mention that his morning erections improved after starting Cabergoline -If his testosterone levels do not improve after resolution of hyperprolactinemia, he will need testosterone replacement -He is not interested in fertility  Philemon Kingdom, MD PhD [facility]

## 2020-12-15 ENCOUNTER — Other Ambulatory Visit (HOSPITAL_BASED_OUTPATIENT_CLINIC_OR_DEPARTMENT_OTHER): Payer: Self-pay

## 2021-01-27 ENCOUNTER — Ambulatory Visit (HOSPITAL_COMMUNITY)
Admission: RE | Admit: 2021-01-27 | Discharge: 2021-01-27 | Disposition: A | Discharge status: Home / Self Care | Payer: No Typology Code available for payment source | Source: Ambulatory Visit | Attending: Urology | Admitting: Urology

## 2021-01-27 ENCOUNTER — Other Ambulatory Visit (HOSPITAL_COMMUNITY): Payer: Self-pay | Admitting: Urology

## 2021-01-27 ENCOUNTER — Other Ambulatory Visit: Payer: Self-pay

## 2021-01-27 DIAGNOSIS — C642 Malignant neoplasm of left kidney, except renal pelvis: Secondary | ICD-10-CM | POA: Diagnosis present

## 2021-05-19 DIAGNOSIS — E139 Other specified diabetes mellitus without complications: Secondary | ICD-10-CM | POA: Insufficient documentation

## 2021-06-20 ENCOUNTER — Other Ambulatory Visit (HOSPITAL_COMMUNITY): Payer: Self-pay | Admitting: Neurosurgery

## 2021-06-20 DIAGNOSIS — D352 Benign neoplasm of pituitary gland: Secondary | ICD-10-CM

## 2021-07-18 ENCOUNTER — Other Ambulatory Visit: Payer: Self-pay | Admitting: Internal Medicine

## 2021-09-18 ENCOUNTER — Other Ambulatory Visit: Payer: Self-pay | Admitting: Internal Medicine

## 2021-12-22 ENCOUNTER — Telehealth: Payer: Self-pay | Admitting: Internal Medicine

## 2021-12-22 NOTE — Telephone Encounter (Signed)
No, I usually check labs when they come in.

## 2021-12-22 NOTE — Telephone Encounter (Signed)
Patient called and scheduled visit and wanted to know if he should have labs done before visit.  Call back # [phone]

## 2021-12-22 NOTE — Telephone Encounter (Signed)
Lvm for pt advising labs are typically checked after appt.

## 2022-01-11 ENCOUNTER — Encounter (INDEPENDENT_AMBULATORY_CARE_PROVIDER_SITE_OTHER): Payer: Self-pay | Admitting: *Deleted

## 2022-03-19 ENCOUNTER — Encounter: Payer: Self-pay | Admitting: Internal Medicine

## 2022-03-19 ENCOUNTER — Ambulatory Visit (INDEPENDENT_AMBULATORY_CARE_PROVIDER_SITE_OTHER): Payer: PRIVATE HEALTH INSURANCE | Admitting: Internal Medicine

## 2022-03-19 VITALS — BP 110/76 | HR 71 | Ht 74.0 in | Wt 346.2 lb

## 2022-03-19 DIAGNOSIS — D497 Neoplasm of unspecified behavior of endocrine glands and other parts of nervous system: Secondary | ICD-10-CM

## 2022-03-19 DIAGNOSIS — E230 Hypopituitarism: Secondary | ICD-10-CM

## 2022-03-19 DIAGNOSIS — R7989 Other specified abnormal findings of blood chemistry: Secondary | ICD-10-CM

## 2022-03-19 DIAGNOSIS — E291 Testicular hypofunction: Secondary | ICD-10-CM | POA: Diagnosis not present

## 2022-03-19 NOTE — Patient Instructions (Addendum)
Please stop at the lab.  Continue cabergoline 0.25 mg 2x a week.  Please plan to come back for labs fasting 8-9 am.  In 05/2022, send me a message to order the new MRI.  Please come back for a follow-up appointment in 6 months.

## 2022-03-19 NOTE — Progress Notes (Signed)
Patient ID: Edwin Rogers, male   DOB: Jul 12, 1970, 51 y.o.   MRN: [id number]   HPI  Edwin Rogers is a 51 y.o.-year-old male, initially referred by his PCP, Dr. Anastasio Champion, returning for follow-up for hyperprolactinemia and pituitary adenoma. Last visit 1 year 9 months ago.  Interim history: Last visit, he was doing intermittent fasting, losing weight-feeling better.  He then started to gain weight, down to 360 pounds. In 04/2021, he had a wrist steroid injection >> increased urination, blurry vision >> CBG 400 >> started Metformin. He changed his diet >> lost 35 lbs >> gained back ~50% 2/2 stress eating. However, sugars started to improve and HbA1c was 5.6% at last check with PCP. He feels that his symptoms improved significantly after starting Cabergoline.  Reviewed and addended history: Patient described a history of fatigue, difficulty losing weight despite intermittent fasting - 16:8 hours, he also has HTN, OSA, and a slightly increased HbA1c of 5.8%.  He was found to have a very low testosterone in 09/2019.   He was found to have a very high prolactin level in 10/2019 during investigation for hypogonadism.  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 Sabra Heck): normal  Pituitary MRI (06/09/2020): 3 x 2.6 x 3.2 cm pituitary mass extending in the cavernous sinus, not much change from before.  05/2021: VF: normal, reportedly  We started Cabergoline 0.25 mg twice a week and he continues on this without side effects: no dizziness, congestion, headache, nausea.  Reviewed prolactin levels: Lab Results  Component Value Date   PROLACTIN 30.0 (H) 06/02/2020   PROLACTIN 758.2 (H) 11/26/2019   PROLACTIN 683.1 (H) 11/17/2019   We investigated the rest of his pituitary hormones: His prolactin level remains high, FSH, LH, and testosterone  levels were low, ACTH was slightly high (but improving)  and cortisol  was normal. IGF-I and growth hormone were also normal:  Of note, ferritin and iron saturation were normal 10/2019.  Previous TFTs were normal: Lab Results  Component Value Date   TSH 2.71 06/02/2020   TSH 4.62 (H) 11/26/2019   TSH 3.13 10/08/2019   FREET4 0.76 06/02/2020   FREET4 0.76 11/26/2019    Lab Results  Component Value Date   FREET4 0.76 06/02/2020   FREET4 0.76 11/26/2019   T3FREE 3.6 06/02/2020   T3FREE 3.4 11/26/2019   T3FREE 2.9 10/08/2019   No galactorrhea, but had occasional breast tenderness (not bothering him anymore) and enlargement. No headaches or blurry/double vision. Not on Risperdal, Reglan, not using any drugs including marijuana.  He tolerates the Cabergoline well, without any side effects. He did not notice any improvement in morning erections after starting Cabergoline.  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  Also, he has anemia.  He has an adopted 67 y/o son. He is not interested in having more children.  ROS: + see HPI  I reviewed pt's medications, allergies, PMH, social hx, family hx, and changes were documented in the history of present illness. Otherwise, unchanged from my initial visit note.  Past Medical History:  Diagnosis Date   GERD (gastroesophageal  reflux disease)    Hypertension    pt states currently on no medications.   Left groin hernia    Sleep apnea    Diagnosed 2014-uses CPAP.   Past Surgical History:  Procedure Laterality Date   ANKLE FRACTURE SURGERY     last year with a plate   CHOLECYSTECTOMY  09/22/2015   COLONOSCOPY N/A 05/27/2013   Procedure: COLONOSCOPY;  Surgeon: Rogene Houston, MD;  [facility];   Service: Endoscopy;  Laterality: N/A;  100   COLONOSCOPY N/A 09/26/2016   Procedure: COLONOSCOPY;  Surgeon: Rogene Houston, MD;  [facility];  Service: Endoscopy;  Laterality: N/A;  1:00   ORIF ANKLE FRACTURE  05/10/2012   Procedure: OPEN REDUCTION INTERNAL FIXATION (ORIF) ANKLE FRACTURE;  Surgeon: Marin Shutter, MD;  Location: [place];  Service: Orthopedics;  Laterality: Right;   POLYPECTOMY  09/26/2016   Procedure: POLYPECTOMY;  Surgeon: Rogene Houston, MD;  [facility];  Service: Endoscopy;;  colon   ROBOT ASSISTED LAPAROSCOPIC NEPHRECTOMY N/A 09/22/2015   Procedure: XI ROBOTIC ASSISTED LAPAROSCOPIC LEFT RADIAL NEPHRECTOMY WITH EXTENSIVE LAPAROSCOPIC ADHESIOLYSIS ;  Surgeon: Alexis Frock, MD;  Location[facility];  Service: Urology;  Laterality: N/A;   Social History   Socioeconomic History   Marital status: Married    Spouse name: Not on file   Number of children: 1   Years of education: Not on file   Highest education level: Not on file  Occupational History   Not on file  Tobacco Use   Smoking status: Never   Smokeless tobacco: Never  Vaping Use   Vaping Use: Never used  Substance and Sexual Activity   Alcohol use: No   Drug use: No   Sexual activity: Not on file  Other Topics Concern   Not on file  Social History Narrative   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   Has an adopted son - 2 y/o in 2021   Social Determinants of Health   Financial Resource Strain: Not on file  Food Insecurity: Not on file  Transportation Needs: Not on file  Physical Activity: Not on file  Stress: Not on file  Social Connections: Not on file  Intimate Partner Violence: Not on file   Current Outpatient Medications on File Prior to Visit  Medication Sig Dispense Refill   cabergoline (DOSTINEX) 0.5 MG tablet TAKE 1/2 OF A TABLET BY MOUTH 2 TIMES A WEEK. 15 tablet 3   famotidine-calcium carbonate-magnesium  hydroxide (PEPCID COMPLETE) 10-800-165 MG CHEW chewable tablet Chew 1 tablet by mouth at bedtime. Heartburn     tiZANidine (ZANAFLEX) 4 MG tablet Take 4 mg by mouth 3 (three) times daily.     No current facility-administered medications on file prior to visit.   Allergies  Allergen Reactions   Other Nausea And Vomiting    ALL PAIN MEDS   Family History  Problem Relation Age of Onset   Colon cancer Father    Kidney disease Father    Colon cancer Other    Colon cancer Other    Hypertension Mother    PE: BP 110/76 (BP Location: Left Arm, Patient Position: Sitting, Cuff Size: Normal)   Pulse 71   Ht '6\' 2"'$  (1.88 m)   Wt (!) 346 lb 3.2 oz (157 kg)   SpO2 97%   BMI 44.45 kg/m  Wt Readings from Last 3 Encounters:  03/19/22 (!) 346 lb  3.2 oz (157 kg)  06/02/20 (!) 347 lb 3.2 oz (157.5 kg)  03/07/20 (!) 345 lb 9.6 oz (156.8 kg)   Constitutional: overweight, in NAD Eyes: EOMI, no exophthalmos ENT: no thyromegaly, no cervical lymphadenopathy Cardiovascular: RRR, No MRG, + B pitting edema R (history of ankle fracture)>L Respiratory: CTA B Musculoskeletal: no deformities Skin: no rashes Neurological: no tremor with outstretched hands  ASSESSMENT and PLAN: 1.  Hyperprolactinemia -Patient with new dipatient with history of hyperprolactinemia, most likely related to his pituitary adenoma. -He had another pituitary MRI since last visit and, surprisingly, the tumor did not significantly change in size compared to the previous MRI.  However, we discussed that sometimes it may take a longer time for the tumor to shrink.  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 -He had a very high prolactin initially, which point towards a prolactinoma, rather than just compression -The tumor was larger than 1 cm, qualified as a macro, rather than a micro adenoma.  This is extending in the right cavernous sinus -Pituitary hormones were normal with the exception of prolactin and also ACTH.  However, cortisol was normal.  ACTH was improving at last check. -Today we will check his prolactin, but he will return fasting, between 8 and 9 in the morning for ACTH, cortisol, LH, FSH, testosterone, and TFTs  3.  Hypogonadotropic hypogonadism -We previously checked a testosterone level, along with LH and FSH and they were all low, consistent with hypogonadotropic hypogonadism, most likely induced by the high prolactin. -we discussed that more than 70% of the patients normalized their testosterone levels 1 year after normalization of prolactin -plan to repeat a PRL level today. If normal, we will recheck his testosterone level -At last visit he mentioned that his morning erections improved after starting Cabergoline -this is a good sign -However, he may need testosterone replacement if the testosterone level does not recover completely after resolution of his hyperprolactinemia -he is not interested in fertility  Philemon Kingdom, MD PhD [facility]

## 2022-03-20 LAB — PROLACTIN: Prolactin: 67 ng/mL — ABNORMAL HIGH (ref 2.0–18.0)

## 2022-03-20 NOTE — Addendum Note (Signed)
Addended by: Philemon Kingdom on: 03/20/2022 02:03 PM   Modules accepted: Orders

## 2022-04-11 ENCOUNTER — Telehealth: Payer: Self-pay | Admitting: *Deleted

## 2022-04-11 NOTE — Telephone Encounter (Signed)
Referring MD/PCP: Dr. Gerarda Fraction  Procedure: Colonoscopy  Reason/Indication:  hx polyps, [place] [facility]  Has patient had this procedure before?  2019  If so, when, by whom and where?    Is there a family history of colon cancer?  Yes father, grandmother, great grandfather  Who?  What age when diagnosed?    Is patient diabetic? If yes, Type 1 or Type 2   yes, type 2      Does patient have prosthetic heart valve or mechanical valve?  no  Do you have a pacemaker/defibrillator?  no  Has patient ever had endocarditis/atrial fibrillation? no  Does patient use oxygen? no  Has patient had joint replacement within last 12 months?  no  Is patient constipated or do they take laxatives? no  Does patient have a history of alcohol/drug use?  no  Have you had a stroke/heart attack last 6 mths? no  Do you take medicine for weight loss?  no  Is patient on blood thinner such as Coumadin, Plavix and/or Aspirin? no  Medications:  Current Outpatient Medications on File Prior to Visit  Medication Sig Dispense Refill   rosuvastatin (CRESTOR) 5 MG tablet Take 5 mg by mouth daily.     cabergoline (DOSTINEX) 0.5 MG tablet TAKE 1/2 OF A TABLET BY MOUTH 2 TIMES A WEEK. 15 tablet 3   CONTOUR NEXT TEST test strip 1 each daily.     famotidine-calcium carbonate-magnesium hydroxide (PEPCID COMPLETE) 10-800-165 MG CHEW chewable tablet Chew 1 tablet by mouth at bedtime. Heartburn     losartan (COZAAR) 25 MG tablet Take 25 mg by mouth daily.     metFORMIN (GLUCOPHAGE) 1000 MG tablet Take 1,000 mg by mouth 2 (two) times daily.     tiZANidine (ZANAFLEX) 4 MG tablet Take 4 mg by mouth 3 (three) times daily.     No current facility-administered medications on file prior to visit.     Allergies:  Allergies  Allergen Reactions   Other Nausea And Vomiting    ALL PAIN MEDS

## 2022-04-11 NOTE — Telephone Encounter (Signed)
Per Dr. Jenetta Downer, room 1  LM on 10/31 and 11/22. Letter mailed

## 2022-05-28 MED ORDER — PEG 3350-KCL-NA BICARB-NACL 420 G PO SOLR: 4000.0000 mL | Freq: Once | ORAL | 0 refills | Status: AC

## 2022-05-28 NOTE — Telephone Encounter (Signed)
Pt called back. He has been scheduled for 1/31 at 11am. Aware will send instructions. Rx for prep sent to pharmacy.

## 2022-05-28 NOTE — Addendum Note (Signed)
Addended by: Cheron Every on: 05/28/2022 03:25 PM   Modules accepted: Orders

## 2022-05-28 NOTE — Telephone Encounter (Signed)
Called [facility] and no PA is required for TCS. Spoke with Pam I.

## 2022-05-29 ENCOUNTER — Encounter (INDEPENDENT_AMBULATORY_CARE_PROVIDER_SITE_OTHER): Payer: Self-pay | Admitting: *Deleted

## 2022-06-20 ENCOUNTER — Ambulatory Visit (HOSPITAL_COMMUNITY): Payer: PRIVATE HEALTH INSURANCE | Admitting: Anesthesiology

## 2022-06-20 ENCOUNTER — Ambulatory Visit (HOSPITAL_COMMUNITY)
Admission: RE | Admit: 2022-06-20 | Discharge: 2022-06-20 | Disposition: A | Discharge status: Home / Self Care | Payer: PRIVATE HEALTH INSURANCE | Attending: Gastroenterology | Admitting: Gastroenterology

## 2022-06-20 ENCOUNTER — Encounter (HOSPITAL_COMMUNITY): Payer: Self-pay | Admitting: Gastroenterology

## 2022-06-20 ENCOUNTER — Ambulatory Visit (HOSPITAL_BASED_OUTPATIENT_CLINIC_OR_DEPARTMENT_OTHER): Payer: PRIVATE HEALTH INSURANCE | Admitting: Anesthesiology

## 2022-06-20 ENCOUNTER — Other Ambulatory Visit: Payer: Self-pay

## 2022-06-20 ENCOUNTER — Encounter (HOSPITAL_COMMUNITY)
Admission: RE | Disposition: A | Discharge status: Home / Self Care | Payer: Self-pay | Source: Home / Self Care | Attending: Gastroenterology

## 2022-06-20 DIAGNOSIS — D122 Benign neoplasm of ascending colon: Secondary | ICD-10-CM | POA: Diagnosis not present

## 2022-06-20 DIAGNOSIS — G4730 Sleep apnea, unspecified: Secondary | ICD-10-CM | POA: Insufficient documentation

## 2022-06-20 DIAGNOSIS — K644 Residual hemorrhoidal skin tags: Secondary | ICD-10-CM

## 2022-06-20 DIAGNOSIS — Z6841 Body Mass Index (BMI) 40.0 and over, adult: Secondary | ICD-10-CM | POA: Insufficient documentation

## 2022-06-20 DIAGNOSIS — Z860100 Personal history of colon polyps, unspecified: Secondary | ICD-10-CM

## 2022-06-20 DIAGNOSIS — D123 Benign neoplasm of transverse colon: Secondary | ICD-10-CM | POA: Diagnosis not present

## 2022-06-20 DIAGNOSIS — I10 Essential (primary) hypertension: Secondary | ICD-10-CM | POA: Diagnosis not present

## 2022-06-20 DIAGNOSIS — Z1211 Encounter for screening for malignant neoplasm of colon: Secondary | ICD-10-CM | POA: Insufficient documentation

## 2022-06-20 DIAGNOSIS — D124 Benign neoplasm of descending colon: Secondary | ICD-10-CM

## 2022-06-20 DIAGNOSIS — K635 Polyp of colon: Secondary | ICD-10-CM | POA: Diagnosis not present

## 2022-06-20 DIAGNOSIS — Z800 Family history of malignant neoplasm of digestive organs: Secondary | ICD-10-CM | POA: Diagnosis not present

## 2022-06-20 DIAGNOSIS — K219 Gastro-esophageal reflux disease without esophagitis: Secondary | ICD-10-CM | POA: Insufficient documentation

## 2022-06-20 HISTORY — PX: COLONOSCOPY WITH PROPOFOL: SHX5780

## 2022-06-20 HISTORY — PX: POLYPECTOMY: SHX149

## 2022-06-20 LAB — HM COLONOSCOPY

## 2022-06-20 LAB — GLUCOSE, CAPILLARY: Glucose-Capillary: 88 mg/dL (ref 70–99)

## 2022-06-20 SURGERY — COLONOSCOPY WITH PROPOFOL
Anesthesia: General

## 2022-06-20 MED ORDER — PROPOFOL 500 MG/50ML IV EMUL
INTRAVENOUS | Status: AC
  Filled 2022-06-20: qty 50

## 2022-06-20 MED ORDER — STERILE WATER FOR IRRIGATION IR SOLN
Status: DC | PRN
  Administered 2022-06-20: 60 mL
  Administered 2022-06-20: 120 mL

## 2022-06-20 MED ORDER — LACTATED RINGERS IV SOLN: INTRAVENOUS | Status: DC

## 2022-06-20 MED ORDER — PROPOFOL 10 MG/ML IV BOLUS
INTRAVENOUS | Status: DC | PRN
  Administered 2022-06-20: 100 mg via INTRAVENOUS

## 2022-06-20 MED ORDER — PROPOFOL 500 MG/50ML IV EMUL
INTRAVENOUS | Status: DC | PRN
  Administered 2022-06-20: 150 ug/kg/min via INTRAVENOUS

## 2022-06-20 NOTE — Anesthesia Preprocedure Evaluation (Addendum)
Anesthesia Evaluation  Patient identified by MRN, date of birth, ID band Patient awake    Reviewed: Allergy & Precautions, H&P , NPO status , Patient's Chart, lab work & pertinent test results  Airway Mallampati: III  TM Distance: >3 FB Neck ROM: Full    Dental  (+) Dental Advisory Given, Teeth Intact   Pulmonary sleep apnea and Continuous Positive Airway Pressure Ventilation    Pulmonary exam normal breath sounds clear to auscultation       Cardiovascular Exercise Tolerance: Good hypertension, Pt. on medications Normal cardiovascular exam Rhythm:Regular Rate:Normal     Neuro/Psych negative neurological ROS  negative psych ROS   GI/Hepatic Neg liver ROS,GERD  Medicated and Controlled,,  Endo/Other    Morbid obesity  Renal/GU negative Renal ROS  negative genitourinary   Musculoskeletal negative musculoskeletal ROS (+)    Abdominal   Peds negative pediatric ROS (+)  Hematology negative hematology ROS (+)   Anesthesia Other Findings   Reproductive/Obstetrics negative OB ROS                              Anesthesia Physical Anesthesia Plan  ASA: 3  Anesthesia Plan: General   Post-op Pain Management: Minimal or no pain anticipated   Induction: Intravenous  PONV Risk Score and Plan: 1 and Propofol infusion  Airway Management Planned: Nasal Cannula, Natural Airway and Simple Face Mask  Additional Equipment:   Intra-op Plan:   Post-operative Plan:   Informed Consent: I have reviewed the patients History and Physical, chart, labs and discussed the procedure including the risks, benefits and alternatives for the proposed anesthesia with the patient or authorized representative who has indicated his/her understanding and acceptance.     Dental advisory given  Plan Discussed with: CRNA and Surgeon  Anesthesia Plan Comments:         Anesthesia Quick Evaluation

## 2022-06-20 NOTE — Discharge Instructions (Addendum)
You are being discharged to home.  Resume your previous diet.  We are waiting for your pathology results.  Your physician has recommended a repeat colonoscopy in three years for surveillance.  

## 2022-06-20 NOTE — Anesthesia Postprocedure Evaluation (Signed)
Anesthesia Post Note  Patient: Edwin Rogers  Procedure(s) Performed: COLONOSCOPY WITH PROPOFOL POLYPECTOMY INTESTINAL  Patient location during evaluation: Phase II Anesthesia Type: General Level of consciousness: awake and alert and oriented Pain management: pain level controlled Vital Signs Assessment: post-procedure vital signs reviewed and stable Respiratory status: spontaneous breathing, nonlabored ventilation and respiratory function stable Cardiovascular status: blood pressure returned to baseline and stable Postop Assessment: no apparent nausea or vomiting Anesthetic complications: no  No notable events documented.   Last Vitals:  Vitals:   06/20/22 0940 06/20/22 1112  BP: (!) 145/70 115/66  Pulse: 64 74  Resp: 18 (!) 22  Temp: 36.9 C 36.4 C  SpO2: 99% 97%    Last Pain:  Vitals:   06/20/22 1112  TempSrc: Oral  PainSc: 0-No pain                 Avanell Banwart C Dalesha Stanback

## 2022-06-20 NOTE — Op Note (Signed)
[facility] Patient Name: Edwin Rogers Procedure Date: 06/20/2022 10:18 AM MRN: [id number] Date of Birth: 1970-06-20 Attending MD: Maylon Peppers , , [id number] CSN: [id number] Age: 52 Admit Type: Outpatient Procedure:                Colonoscopy Indications:              Surveillance: Personal history of adenomatous                            polyps on last colonoscopy 5 years ago Providers:                Maylon Peppers, Crystal Page, Aram Candela Referring MD:              Medicines:                Monitored Anesthesia Care Complications:            No immediate complications. Estimated Blood Loss:     Estimated blood loss: none. Procedure:                Pre-Anesthesia Assessment:                           - Prior to the procedure, a History and Physical                            was performed, and patient medications, allergies                            and sensitivities were reviewed. The patient's                            tolerance of previous anesthesia was reviewed.                           - The risks and benefits of the procedure and the                            sedation options and risks were discussed with the                            patient. All questions were answered and informed                            consent was obtained.                           - ASA Grade Assessment: II - A patient with mild                            systemic disease.                           After obtaining informed consent, the colonoscope                            was passed under direct vision.  Throughout the                            procedure, the patient's blood pressure, pulse, and                            oxygen saturations were monitored continuously. The                            [id number]-[id number] ([id number]) scope was introduced through                            the anus and advanced to the the cecum, identified                            by appendiceal  orifice and ileocecal valve. The                            patient tolerated the procedure well. The                            colonoscopy was technically difficult and complex                            due to significant looping. Successful completion                            of the procedure was aided by applying abdominal                            pressure. The quality of the bowel preparation was                            good. Scope In: 10:30:47 AM Scope Out: 11:08:07 AM Scope Withdrawal Time: 0 hours 27 minutes 13 seconds  Total Procedure Duration: 0 hours 37 minutes 20 seconds  Findings:      The perianal and digital rectal examinations were normal.      The ascending colon revealed significantly excessive looping.       Intermittent abdominal pressure was applied in the epigastric area to       effectively advance the scope.      Four sessile and semi-pedunculated polyps were found in the transverse       colon and ascending colon. The polyps were 3 to 10 mm in size. These       polyps were removed with a cold snare. Resection and retrieval were       complete.      A 4 mm polyp was found in the descending colon. The polyp was sessile.      The retroflexed view of the distal rectum and anal verge was normal and       showed no anal or rectal abnormalities. Impression:               - There was significant looping of the colon.                           -  Four 3 to 10 mm polyps in the transverse colon                            and in the ascending colon, removed with a cold                            snare. Resected and retrieved.                           - One 4 mm polyp in the descending colon.                           - The distal rectum and anal verge are normal on                            retroflexion view. Moderate Sedation:      Per Anesthesia Care Recommendation:           - Discharge patient to home (ambulatory).                           - Resume previous  diet.                           - Await pathology results.                           - Repeat colonoscopy in 3 years for surveillance. Procedure Code(s):        --- Professional ---                           [phone], Colonoscopy, flexible; with removal of                            tumor(s), polyp(s), or other lesion(s) by snare                            technique Diagnosis Code(s):        --- Professional ---                           Z86.010, Personal history of colonic polyps                           D12.3, Benign neoplasm of transverse colon (hepatic                            flexure or splenic flexure)                           D12.2, Benign neoplasm of ascending colon                           D12.4, Benign neoplasm of descending colon CPT copyright 2022 [employer]. All rights reserved. The codes documented in this report are preliminary and upon coder review may  be revised to meet current compliance requirements. Maylon Peppers, MD Maylon Peppers,  06/20/2022 11:18:13 AM This report has been signed electronically. Number of Addenda: 0

## 2022-06-20 NOTE — H&P (Signed)
Edwin Rogers is an 52 y.o. male.   Chief Complaint: History of colonic polyps and family history of colon cancer HPI: 52 year old male with past medical history of GERD, hypertension, sleep apnea, coming for History of colonic polyps and family history of colon cancer.  Last colonoscopy was performed in 2018, was found to have 2 polyps with largest size of 12 mm, one was tubular adenoma and second 1 was a sessile serrated polyp.  The patient denies having any complaints such as melena, hematochezia, abdominal pain or distention, change in her bowel movement consistency or frequency, no changes in weight recently.  Family history remarkable for 3 family members with colon cancer, father diagnosed in his 83s, grandmother in her 2s as well as his great grandfather   Past Medical History:  Diagnosis Date   GERD (gastroesophageal reflux disease)    Hypertension    pt states currently on no medications.   Left groin hernia    Sleep apnea    Diagnosed 2014-uses CPAP.    Past Surgical History:  Procedure Laterality Date   ANKLE FRACTURE SURGERY     last year with a plate   CHOLECYSTECTOMY  09/22/2015   COLONOSCOPY N/A 05/27/2013   Procedure: COLONOSCOPY;  Surgeon: Rogene Houston, MD;  Location: [facility];  Service: Endoscopy;  Laterality: N/A;  100   COLONOSCOPY N/A 09/26/2016   Procedure: COLONOSCOPY;  Surgeon: Rogene Houston, MD;  Location: [facility];  Service: Endoscopy;  Laterality: N/A;  1:00   ORIF ANKLE FRACTURE  05/10/2012   Procedure: OPEN REDUCTION INTERNAL FIXATION (ORIF) ANKLE FRACTURE;  Surgeon: Marin Shutter, MD;  Location: [place];  Service: Orthopedics;  Laterality: Right;   POLYPECTOMY  09/26/2016   Procedure: POLYPECTOMY;  Surgeon: Rogene Houston, MD;  Location: [facility];  Service: Endoscopy;;  colon   ROBOT ASSISTED LAPAROSCOPIC NEPHRECTOMY N/A 09/22/2015   Procedure: XI ROBOTIC ASSISTED LAPAROSCOPIC LEFT RADIAL NEPHRECTOMY WITH EXTENSIVE LAPAROSCOPIC  ADHESIOLYSIS ;  Surgeon: Alexis Frock, MD;  Location: [facility];  Service: Urology;  Laterality: N/A;    Family History  Problem Relation Age of Onset   Colon cancer Father    Kidney disease Father    Colon cancer Other    Colon cancer Other    Hypertension Mother    Social History:  reports that he has never smoked. He has never used smokeless tobacco. He reports that he does not drink alcohol and does not use drugs.  Allergies:  Allergies  Allergen Reactions   Other Nausea And Vomiting    ALL PAIN MEDS    Medications Prior to Admission  Medication Sig Dispense Refill   cabergoline (DOSTINEX) 0.5 MG tablet TAKE 1/2 OF A TABLET BY MOUTH 2 TIMES A WEEK. 15 tablet 3   CONTOUR NEXT TEST test strip 1 each daily.     famotidine-calcium carbonate-magnesium hydroxide (PEPCID COMPLETE) 10-800-165 MG CHEW chewable tablet Chew 1 tablet by mouth at bedtime as needed (Heartburn).     losartan (COZAAR) 25 MG tablet Take 25 mg by mouth daily.     metFORMIN (GLUCOPHAGE) 1000 MG tablet Take 1,000 mg by mouth 2 (two) times daily.     rosuvastatin (CRESTOR) 5 MG tablet Take 5 mg by mouth daily.     tiZANidine (ZANAFLEX) 4 MG tablet Take 4 mg by mouth at bedtime.      No results found for this or any previous visit (from the past 48 hour(s)). No results found.  Review of  Systems  All other systems reviewed and are negative.   Blood pressure (!) 145/70, pulse 64, temperature 98.5 F (36.9 C), temperature source Oral, resp. rate 18, height '6\' 3"'$  (1.905 m), weight (!) 154.2 kg, SpO2 99 %. Physical Exam  GENERAL: The patient is AO x3, in no acute distress. HEENT: Head is normocephalic and atraumatic. EOMI are intact. Mouth is well hydrated and without lesions. NECK: Supple. No masses LUNGS: Clear to auscultation. No presence of rhonchi/wheezing/rales. Adequate chest expansion HEART: RRR, normal s1 and s2. ABDOMEN: Soft, nontender, no guarding, no peritoneal signs, and nondistended. BS +. No  masses. EXTREMITIES: Without any cyanosis, clubbing, rash, lesions or edema. NEUROLOGIC: AOx3, no focal motor deficit. SKIN: no jaundice, no rashes  Assessment/Plan 52 year old male with past medical history of GERD, hypertension, sleep apnea, coming for History of colonic polyps and family history of colon cancer. We will proceed with colonoscopy today.   Harvel Quale, MD 06/20/2022, 9:48 AM

## 2022-06-20 NOTE — Transfer of Care (Signed)
Immediate Anesthesia Transfer of Care Note  Patient: Edwin Rogers  Procedure(s) Performed: COLONOSCOPY WITH PROPOFOL POLYPECTOMY INTESTINAL  Patient Location: PACU  Anesthesia Type:General  Level of Consciousness: awake, alert , and oriented  Airway & Oxygen Therapy: Patient Spontanous Breathing  Post-op Assessment: Report given to RN, Post -op Vital signs reviewed and stable, Patient moving all extremities X 4, and Patient able to stick tongue midline  Post vital signs: Reviewed  Last Vitals:  Vitals Value Taken Time  BP 115/66 06/20/22 1112  Temp 36.4 C 06/20/22 1112  Pulse 74 06/20/22 1112  Resp 22 06/20/22 1112  SpO2 97 % 06/20/22 1112    Last Pain:  Vitals:   06/20/22 1112  TempSrc: Oral  PainSc: 0-No pain         Complications: No notable events documented.

## 2022-06-21 ENCOUNTER — Encounter (INDEPENDENT_AMBULATORY_CARE_PROVIDER_SITE_OTHER): Payer: Self-pay | Admitting: *Deleted

## 2022-06-25 LAB — SURGICAL PATHOLOGY

## 2022-06-26 ENCOUNTER — Encounter (HOSPITAL_COMMUNITY): Payer: Self-pay | Admitting: Gastroenterology

## 2022-09-18 ENCOUNTER — Encounter: Payer: Self-pay | Admitting: Internal Medicine

## 2022-09-18 ENCOUNTER — Ambulatory Visit: Payer: PRIVATE HEALTH INSURANCE | Admitting: Internal Medicine

## 2022-09-18 VITALS — BP 128/74 | HR 63 | Ht 75.0 in | Wt 339.6 lb

## 2022-09-18 DIAGNOSIS — R7989 Other specified abnormal findings of blood chemistry: Secondary | ICD-10-CM | POA: Diagnosis not present

## 2022-09-18 DIAGNOSIS — D497 Neoplasm of unspecified behavior of endocrine glands and other parts of nervous system: Secondary | ICD-10-CM | POA: Diagnosis not present

## 2022-09-18 DIAGNOSIS — E230 Hypopituitarism: Secondary | ICD-10-CM | POA: Diagnosis not present

## 2022-09-18 NOTE — Progress Notes (Signed)
Patient ID: Edwin Rogers, male   DOB: 05-28-1970, 52 y.o.   MRN: [id number]   HPI  Edwin Rogers is a 52 y.o.-year-old male, initially referred by his PCP, Dr. Karilyn Cota, returning for follow-up for hyperprolactinemia and pituitary adenoma. Last visit 6 months ago.  Interim history: At today's visit, he has good energy and no complaints.  Reviewed and addended history: Patient described a history of fatigue, difficulty losing weight despite intermittent fasting - 16:8 hours, he also has HTN, OSA, and a slightly increased HbA1c of 5.8%.  He was found to have a very low testosterone in 09/2019.   He was found to have a very high prolactin level in 10/2019 during investigation for hypogonadism.  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 Hyacinth Meeker): normal  Pituitary MRI (06/09/2020): 3 x 2.6 x 3.2 cm pituitary mass extending in the cavernous sinus, not much change from before.  05/2021: VF: normal, reportedly  We started Cabergoline 0.25 mg twice a week and he continues on this without side effects: no dizziness, congestion, headache, nausea.  He missed doses before our visit from 02/2022, not since.  Reviewed prolactin levels: Lab Results  Component Value Date   PROLACTIN 67.0 (H) 03/19/2022   PROLACTIN 30.0 (H) 06/02/2020   PROLACTIN 758.2 (H) 11/26/2019   PROLACTIN 683.1 (H) 11/17/2019   We investigated the rest of his pituitary hormones: His prolactin level remains high, FSH, LH, and testosterone levels were low, ACTH was slightly high (but improving)  and cortisol  was normal. IGF-I and growth hormone were also normal:  Of note, ferritin and iron saturation were normal 10/2019.  Previous TFTs were normal: Lab Results  Component Value Date   TSH 2.71 06/02/2020   TSH 4.62 (H) 11/26/2019   TSH 3.13 10/08/2019   FREET4 0.76  06/02/2020   FREET4 0.76 11/26/2019    Lab Results  Component Value Date   FREET4 0.76 06/02/2020   FREET4 0.76 11/26/2019   T3FREE 3.6 06/02/2020   T3FREE 3.4 11/26/2019   T3FREE 2.9 10/08/2019   No galactorrhea, but had occasional breast tenderness (not bothering him anymore) and enlargement. No headaches or blurry/double vision. Not on Risperdal, Reglan, not using any drugs including marijuana.  He tolerates the Cabergoline well, without any side effects. He did not notice any improvement in morning erections after starting Cabergoline.  DM2: - managed by PCP - In 04/2021, he had a wrist steroid injection >> increased urination, blurry vision >> CBG 400 >> started Metformin. 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  Also, he has anemia.  He has an adopted 22 y/o son. He is not interested in having more children.  ROS: + see HPI  I reviewed pt's medications, allergies, PMH, social hx, family hx, and changes were documented in the history of present illness. Otherwise, unchanged from my initial visit note.  Past Medical History:  Diagnosis Date   GERD (gastroesophageal reflux disease)    Hypertension    pt states currently on no medications.  Left groin hernia    Sleep apnea    Diagnosed 2014-uses CPAP.   Past Surgical History:  Procedure Laterality Date   ANKLE FRACTURE SURGERY     last year with a plate   CHOLECYSTECTOMY  09/22/2015   COLONOSCOPY N/A 05/27/2013   Procedure: COLONOSCOPY;  Surgeon: Malissa Hippo, MD;  Location: [facility];  Service: Endoscopy;  Laterality: N/A;  100   COLONOSCOPY N/A 09/26/2016   Procedure: COLONOSCOPY;  Surgeon: Malissa Hippo,  MD;  Location: [facility];  Service: Endoscopy;  Laterality: N/A;  1:00   COLONOSCOPY WITH PROPOFOL N/A 06/20/2022   Procedure: COLONOSCOPY WITH PROPOFOL;  Surgeon: Dolores Frame, MD;  Location: [facility];  Service: Gastroenterology;  Laterality: N/A;  11:00am, asa 1-2, pt does not want to move up   ORIF ANKLE FRACTURE  05/10/2012   Procedure: OPEN REDUCTION INTERNAL FIXATION (ORIF) ANKLE FRACTURE;  Surgeon: Senaida Lange, MD;  Location: [facility];  Service: Orthopedics;  Laterality: Right;   POLYPECTOMY  09/26/2016   Procedure: POLYPECTOMY;  Surgeon: Malissa Hippo, MD;  Location: [facility];  Service: Endoscopy;;  colon   POLYPECTOMY  06/20/2022   Procedure: POLYPECTOMY INTESTINAL;  Surgeon: Dolores Frame, MD;  Location: [facility];  Service: Gastroenterology;;   ROBOT ASSISTED LAPAROSCOPIC NEPHRECTOMY N/A 09/22/2015   Procedure: XI ROBOTIC ASSISTED LAPAROSCOPIC LEFT RADIAL NEPHRECTOMY WITH EXTENSIVE LAPAROSCOPIC ADHESIOLYSIS ;  Surgeon: Sebastian Ache, MD;  Location: [facility];  Service: Urology;  Laterality: N/A;   Social History   Socioeconomic History   Marital status: Married    Spouse name: Not on file   Number of children: 1   Years of education: Not on file   Highest education level: Not on file  Occupational History   Not on file  Tobacco Use   Smoking status: Never   Smokeless tobacco: Never  Vaping Use   Vaping Use: Never used  Substance and Sexual Activity   Alcohol use: No   Drug use: No   Sexual activity: Not on file  Other Topics Concern   Not on file  Social History Narrative   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   Has an adopted son - 40 y/o in 2021   Social Determinants of Health   Financial Resource Strain: Not on file  Food Insecurity: Not on file  Transportation Needs: Not on file  Physical Activity: Not on file  Stress: Not on file  Social Connections: Not on  file  Intimate Partner Violence: Not on file   Current Outpatient Medications on File Prior to Visit  Medication Sig Dispense Refill   cabergoline (DOSTINEX) 0.5 MG tablet TAKE 1/2 OF A TABLET BY MOUTH 2 TIMES A WEEK. 15 tablet 3   CONTOUR NEXT TEST test strip 1 each daily.     famotidine-calcium carbonate-magnesium hydroxide (PEPCID COMPLETE) 10-800-165 MG CHEW chewable tablet Chew 1 tablet by mouth at bedtime as needed (Heartburn).     losartan (COZAAR) 25 MG tablet Take 25 mg by mouth daily.     metFORMIN (GLUCOPHAGE) 1000 MG tablet Take 1,000 mg by mouth 2 (two) times daily.     rosuvastatin (CRESTOR) 5 MG tablet Take 5 mg by mouth daily.     tiZANidine (ZANAFLEX) 4 MG tablet Take 4 mg by mouth at bedtime.     No current facility-administered medications on file prior to visit.   Allergies  Allergen Reactions  Other Nausea And Vomiting    ALL PAIN MEDS   Family History  Problem Relation Age of Onset   Colon cancer Father    Kidney disease Father    Colon cancer Other    Colon cancer Other    Hypertension Mother    PE: BP 128/74 (BP Location: Left Arm, Patient Position: Sitting, Cuff Size: Normal)   Pulse 63   Ht 6\' 3"  (1.905 m)   Wt (!) 339 lb 9.6 oz (154 kg)   SpO2 99%   BMI 42.45 kg/m  Wt Readings from Last 3 Encounters:  09/18/22 (!) 339 lb 9.6 oz (154 kg)  06/20/22 (!) 340 lb (154.2 kg)  03/19/22 (!) 346 lb 3.2 oz (157 kg)   Constitutional: overweight, in NAD Eyes: EOMI, no exophthalmos ENT: no thyromegaly, no cervical lymphadenopathy Cardiovascular: RRR, No MRG, + B pitting edema R (history of ankle fracture)>L Respiratory: CTA B Musculoskeletal: no deformities Skin: no rashes Neurological: no tremor with outstretched hands  ASSESSMENT and PLAN: 1.  Hyperprolactinemia -Patient with new dipatient with history of hyperprolactinemia, most likely related to his pituitary adenoma. -He had another pituitary MRI since last visit and, surprisingly, the tumor did  not significantly change in size compared to the previous MRI.  However, we discussed that sometimes it may take a longer time for the tumor to shrink.  We definitely need another MRI now. -He has hypogonadotropic hypogonadism as a complication -On Cabergoline: 0.25 mg twice a week   -No side effects including rhinorrhea (sign of CSF leak), no congestion, headaches, orthostasis, dizziness, nausea -He felt better after starting Cabergoline, also with improved erections -At last visit, Prolactin level was higher than before, but he admitted of missing few doses of Cabergoline.  He is not taking this consistently. 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  This is extending in the right cavernous sinus -His pituitary hormones were normal with the exception of prolactin and also an elevated ACTH, with a normal cortisol.  ACTH was improving at last checks, though. -At last visit, I advised him to return fasting, between 8 and 9 in the morning for ACTH, cortisol, LH, FSH, testosterone, and TFTs, but he did not return for labs... -we will check the above labs  - he will return later this week, fasting.   3.  Hypogonadotropic hypogonadism -We previously checked a testosterone level, along with LH and FSH and they were all low, consistent with hypogonadotropic hypogonadism, most likely induced by the high prolactin -We discussed that more than 70% of patients normalized their testosterone levels 1 year after normalization of prolactin -Plan to do prolactin level today.  If normal, will check a testosterone level -At last  visit he mentioned that his morning erections improved after starting Cabergoline -which is a good sign - He may need testosterone replacement if testosterone levels did not improve after normalization of prolactin -he has not interested in fertility  NEEDS REFILLS!   Component     Latest Ref Rng 09/20/2022  Triiodothyronine,Free,Serum     2.3 - 4.2 pg/mL 3.4   TSH     0.35 - 5.50 uIU/mL 2.18  Prolactin     2.0 - 18.0 ng/mL 79.7 (H)   Cortisol, Plasma     ug/dL 16.1   W9,UEAV(WUJWJX)     0.60 - 1.60 ng/dL 9.14   FSH     1.4 - 78.2 mIU/ML 7.1   LH     1.50 - 9.30 mIU/mL 3.56   Testosterone, Serum (Total)     ng/dL 956 (L)   % Free Testosterone     % 2.1   Free Testosterone, S     pg/mL 22 (L)   Sex Hormone Binding Globulin     nmol/L 22.2   IGF-I, LC/MS     50 - 317 ng/mL 101   Z-Score (Male)     -2.0 - 2.0 SD -0.7   Growth Hormone     < OR = 7.1 ng/mL 0.5   C206 ACTH     6 - 50 pg/mL 89 (H)    Prolactin level is still elevated.  Will increase the dose Cabergoline to 0.5 mg twice a week. The ACTH is still elevated.  However, cortisol level is normal.  Will ask the patient to do a 24-hour urine collection for cortisol. Growth hormone and IGF-I are normal. Testosterone is much improved, still low, but with a significant increase from last check.  Will recheck after we decrease the prolactin more.   Thyroid tests are normal.  MRI is still pending.  Carlus Pavlov, MD PhD [facility]

## 2022-09-18 NOTE — Patient Instructions (Addendum)
Please come back for labs fasting.  Continue cabergoline 0.25 mg 2x a week.  Will order a new MRI.  Please come back for a follow-up appointment in 6 months.

## 2022-09-20 ENCOUNTER — Other Ambulatory Visit (INDEPENDENT_AMBULATORY_CARE_PROVIDER_SITE_OTHER): Payer: PRIVATE HEALTH INSURANCE

## 2022-09-20 DIAGNOSIS — E230 Hypopituitarism: Secondary | ICD-10-CM

## 2022-09-20 DIAGNOSIS — E291 Testicular hypofunction: Secondary | ICD-10-CM

## 2022-09-20 DIAGNOSIS — R7989 Other specified abnormal findings of blood chemistry: Secondary | ICD-10-CM | POA: Diagnosis not present

## 2022-09-20 DIAGNOSIS — D497 Neoplasm of unspecified behavior of endocrine glands and other parts of nervous system: Secondary | ICD-10-CM

## 2022-09-20 LAB — FOLLICLE STIMULATING HORMONE: FSH: 7.1 m[IU]/mL (ref 1.4–18.1)

## 2022-09-20 LAB — T3, FREE: T3, Free: 3.4 pg/mL (ref 2.3–4.2)

## 2022-09-20 LAB — LUTEINIZING HORMONE: LH: 3.56 m[IU]/mL (ref 1.50–9.30)

## 2022-09-20 LAB — TSH: TSH: 2.18 u[IU]/mL (ref 0.35–5.50)

## 2022-09-20 LAB — T4, FREE: Free T4: 0.71 ng/dL (ref 0.60–1.60)

## 2022-09-20 LAB — CORTISOL: Cortisol, Plasma: 10.4 ug/dL

## 2022-09-25 LAB — GROWTH HORMONE: Growth Hormone: 0.5 ng/mL (ref <=7.1)

## 2022-09-25 LAB — ACTH: C206 ACTH: 89 pg/mL — ABNORMAL HIGH (ref 6–50)

## 2022-09-26 LAB — INSULIN-LIKE GROWTH FACTOR
IGF-I, LC/MS: 101 ng/mL (ref 50–317)
Z-Score (Male): -0.7 SD (ref ?–2.0)

## 2022-09-26 LAB — PROLACTIN: Prolactin: 79.7 ng/mL — ABNORMAL HIGH (ref 2.0–18.0)

## 2022-09-28 ENCOUNTER — Other Ambulatory Visit: Payer: Self-pay | Admitting: Internal Medicine

## 2022-09-28 ENCOUNTER — Encounter: Payer: Self-pay | Admitting: Internal Medicine

## 2022-09-28 MED ORDER — CABERGOLINE 0.5 MG PO TABS: ORAL_TABLET | ORAL | 3 refills | Status: DC

## 2022-10-03 LAB — TESTOSTERONE, FREE AND TOTAL (INCLUDES SHBG)-(MALES)
% Free Testosterone: 2.1 %
Free Testosterone, S: 22 pg/mL — ABNORMAL LOW
Sex Hormone Binding Globulin: 22.2 nmol/L
Testosterone, Serum (Total): 106 ng/dL — ABNORMAL LOW

## 2022-11-08 ENCOUNTER — Ambulatory Visit
Admission: RE | Admit: 2022-11-08 | Discharge: 2022-11-08 | Disposition: A | Discharge status: Home / Self Care | Payer: PRIVATE HEALTH INSURANCE | Source: Ambulatory Visit | Attending: Internal Medicine | Admitting: Internal Medicine

## 2022-11-08 DIAGNOSIS — R7989 Other specified abnormal findings of blood chemistry: Secondary | ICD-10-CM

## 2022-11-08 DIAGNOSIS — D497 Neoplasm of unspecified behavior of endocrine glands and other parts of nervous system: Secondary | ICD-10-CM

## 2023-01-14 ENCOUNTER — Encounter (HOSPITAL_COMMUNITY): Payer: Self-pay | Admitting: Radiology

## 2023-01-14 ENCOUNTER — Other Ambulatory Visit (HOSPITAL_COMMUNITY): Payer: Self-pay | Admitting: Internal Medicine

## 2023-01-14 ENCOUNTER — Encounter: Payer: Self-pay | Admitting: Internal Medicine

## 2023-01-14 ENCOUNTER — Encounter (HOSPITAL_COMMUNITY): Payer: Self-pay | Admitting: Internal Medicine

## 2023-01-14 ENCOUNTER — Ambulatory Visit (HOSPITAL_COMMUNITY)
Admission: RE | Admit: 2023-01-14 | Discharge: 2023-01-14 | Disposition: A | Discharge status: Home / Self Care | Payer: No Typology Code available for payment source | Source: Ambulatory Visit | Attending: Internal Medicine | Admitting: Internal Medicine

## 2023-01-14 DIAGNOSIS — R0781 Pleurodynia: Secondary | ICD-10-CM | POA: Insufficient documentation

## 2023-01-14 LAB — POCT I-STAT CREATININE: Creatinine, Ser: 1.2 mg/dL (ref 0.61–1.24)

## 2023-01-14 MED ORDER — IOHEXOL 350 MG/ML SOLN
75.0000 mL | Freq: Once | INTRAVENOUS | Status: AC | PRN
  Administered 2023-01-14: 75 mL via INTRAVENOUS

## 2023-03-13 ENCOUNTER — Encounter: Payer: Self-pay | Admitting: Internal Medicine

## 2023-03-14 ENCOUNTER — Other Ambulatory Visit: Payer: Self-pay | Admitting: Internal Medicine

## 2023-03-14 MED ORDER — LORAZEPAM 0.5 MG PO TABS: 1.0000 mg | ORAL_TABLET | ORAL | 0 refills | Status: DC | PRN

## 2023-03-20 ENCOUNTER — Encounter: Payer: Self-pay | Admitting: Internal Medicine

## 2023-03-20 ENCOUNTER — Ambulatory Visit: Payer: 59 | Admitting: Internal Medicine

## 2023-03-20 VITALS — BP 120/70 | HR 70 | Ht 75.0 in | Wt 326.8 lb

## 2023-03-20 DIAGNOSIS — E230 Hypopituitarism: Secondary | ICD-10-CM

## 2023-03-20 DIAGNOSIS — R7989 Other specified abnormal findings of blood chemistry: Secondary | ICD-10-CM

## 2023-03-20 DIAGNOSIS — D497 Neoplasm of unspecified behavior of endocrine glands and other parts of nervous system: Secondary | ICD-10-CM

## 2023-03-20 LAB — CORTISOL: Cortisol, Plasma: 7.1 ug/dL

## 2023-03-20 NOTE — Patient Instructions (Signed)
Please stop at the lab.  Continue Cabergoline 0.5 mg 2x a week.  Please return in 6 months.

## 2023-03-25 LAB — PROLACTIN: Prolactin: 14.2 ng/mL (ref 2.0–18.0)

## 2023-03-25 LAB — ACTH: C206 ACTH: 63 pg/mL — ABNORMAL HIGH (ref 6–50)

## 2023-05-13 ENCOUNTER — Encounter: Payer: Self-pay | Admitting: Internal Medicine

## 2023-05-20 ENCOUNTER — Ambulatory Visit
Admission: RE | Admit: 2023-05-20 | Discharge: 2023-05-20 | Disposition: A | Discharge status: Home / Self Care | Payer: 59 | Source: Ambulatory Visit | Attending: Internal Medicine | Admitting: Internal Medicine

## 2023-05-20 DIAGNOSIS — D497 Neoplasm of unspecified behavior of endocrine glands and other parts of nervous system: Secondary | ICD-10-CM

## 2023-05-20 DIAGNOSIS — R7989 Other specified abnormal findings of blood chemistry: Secondary | ICD-10-CM

## 2023-05-20 MED ORDER — GADOPICLENOL 0.5 MMOL/ML IV SOLN
10.0000 mL | Freq: Once | INTRAVENOUS | Status: AC | PRN
  Administered 2023-05-20: 10 mL via INTRAVENOUS

## 2023-05-27 ENCOUNTER — Encounter: Payer: Self-pay | Admitting: Internal Medicine

## 2023-07-28 ENCOUNTER — Other Ambulatory Visit: Payer: Self-pay | Admitting: Medical Genetics

## 2023-07-29 ENCOUNTER — Other Ambulatory Visit (HOSPITAL_COMMUNITY)
Admission: RE | Admit: 2023-07-29 | Discharge: 2023-07-29 | Disposition: A | Discharge status: Home / Self Care | Payer: Self-pay | Source: Ambulatory Visit | Attending: Medical Genetics | Admitting: Medical Genetics

## 2023-08-12 LAB — GENECONNECT MOLECULAR SCREEN: Genetic Analysis Overall Interpretation: NEGATIVE

## 2023-08-21 ENCOUNTER — Other Ambulatory Visit (HOSPITAL_COMMUNITY): Payer: Self-pay | Admitting: Internal Medicine

## 2023-08-21 DIAGNOSIS — H9121 Sudden idiopathic hearing loss, right ear: Secondary | ICD-10-CM

## 2023-09-16 ENCOUNTER — Ambulatory Visit: Payer: 59 | Admitting: Internal Medicine

## 2023-09-16 ENCOUNTER — Encounter: Payer: Self-pay | Admitting: Internal Medicine

## 2023-09-16 VITALS — BP 120/72 | HR 62 | Ht 75.0 in | Wt 328.8 lb

## 2023-09-16 DIAGNOSIS — D497 Neoplasm of unspecified behavior of endocrine glands and other parts of nervous system: Secondary | ICD-10-CM

## 2023-09-16 DIAGNOSIS — E230 Hypopituitarism: Secondary | ICD-10-CM | POA: Diagnosis not present

## 2023-09-16 DIAGNOSIS — R7989 Other specified abnormal findings of blood chemistry: Secondary | ICD-10-CM | POA: Diagnosis not present

## 2023-09-16 NOTE — Progress Notes (Addendum)
 Patient ID: Edwin Rogers, male   DOB: [birth year], 53 y.o.   MRN: [id number]   HPI  Edwin Rogers is a 53 y.o.-year-old male, initially referred by his PCP, Dr. Ena Harries, returning for follow-up for hyperprolactinemia and pituitary adenoma. Last visit 6 months ago.  Interim history: He lost 15 lbs before last OV, after increasing the Cabergoline  dose.  Weight is approximately stable afterwards. He has pressure and muffled sounds in R ear. He was on ABx and steroids but persistent. He will see ENT. He has no congestion, fever, cough.  Also, no vision problems or headaches. He cut out sodas 2 mo ago.  He recently had an ophthalmologic follow-up, but he did not have a visual field checked.  Reviewed and addended history: Patient described a history of fatigue, difficulty losing weight despite intermittent fasting - 16:8 hours, he also has HTN, OSA, and a slightly increased HbA1c of 5.8%.  He was found to have a very low testosterone  in 09/2019.   He was found to have a very high prolactin level in 10/2019 during investigation for hypogonadism.  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 Annabell Key): normal  Pituitary MRI (06/09/2020): 3 x 2.6 x 3.2 cm pituitary mass extending in the cavernous sinus, not much change from before.  05/2021: VF: normal, reportedly  Pituitary MRI (05/20/2023): 5 to 10% tumor size decrease: Continued slight further reduction in overall volume of the pituitary adenoma tissue involving the sellar pituitary tissue and extending into the clivus and sphenoid sinus particularly to the right of midline. For instance, of the tumor tissue within the right sphenoid sinus region has decreased from a maximal dimension of 22 mm to a dimension of 20 mm. The other tumor tissue has reduced in volume by similar small amount. No  evidence tumor progression or new extension. Right cavernous sinus involvement appears similar. No development extension into the suprasellar region. No breakthrough of the posterior planum sphenoidale.  We started Cabergoline  0.25 mg twice a week in 11/2019.   We increased the dose of Cabergoline  to 0.5 mg 2x a week in 09/2022.  He missed doses before our visit from 02/2022, not since.  No side effects from Cabergoline : no dizziness, congestion, headache, nausea.  Reviewed prolactin levels: Lab Results  Component Value Date   PROLACTIN 14.2 03/20/2023   PROLACTIN 79.7 (H) 09/20/2022   PROLACTIN 67.0 (H) 03/19/2022   PROLACTIN 30.0 (H) 06/02/2020   PROLACTIN 758.2 (H) 11/26/2019   PROLACTIN 683.1 (H) 11/17/2019   We investigated the rest of his pituitary hormones: His prolactin level remains high, FSH, LH, and testosterone  levels were low, ACTH  was slightly high (but improving)  and cortisol  was normal. IGF-I and growth hormone were also normal: Component     Latest Ref Rng 03/20/2023  Prolactin     2.0 - 18.0 ng/mL 14.2   Cortisol, Plasma     ug/dL 7.1   G644 ACTH      6 - 50 pg/mL 63 (H)     Component     Latest Ref Rng 09/20/2022  Triiodothyronine,Free,Serum     2.3 - 4.2 pg/mL 3.4   TSH     0.35 - 5.50 uIU/mL 2.18   Prolactin     2.0 - 18.0 ng/mL 79.7 (H)   Cortisol, Plasma     ug/dL 03.4   V4,QVZD(GLOVFI)     0.60 -  1.60 ng/dL 1.61   FSH     1.4 - 09.6 mIU/ML 7.1   LH     1.50 - 9.30 mIU/mL 3.56   Testosterone , Serum (Total)     ng/dL 045 (L)   % Free Testosterone      % 2.1   Free Testosterone , S     pg/mL 22 (L)   Sex Hormone Binding Globulin     nmol/L 22.2   IGF-I, LC/MS     50 - 317 ng/mL 101   Z-Score (Male)     -2.0 - 2.0 SD -0.7   Growth Hormone     < OR = 7.1 ng/mL 0.5   C206 ACTH      6 - 50 pg/mL 89 (H)    Prolactin level = still elevated.  Advised him to increase the dose Cabergoline  to 0.5 mg twice a week. The ACTH  = still elevated.   However, cortisol level is normal.  I asked the patient at the last 2 visits to do a 24-hour urine collection for cortisol, but he did not do this yet... Growth hormone and IGF-I = normal. Testosterone  = much improved, still low, but with a significant increase from last check.  We plan to recheck this after prolactin decreased more. Thyroid  tests =  normal.  Previously:  Of note, ferritin and iron saturation were normal 10/2019.  TFTs were normal: Lab Results  Component Value Date   TSH 2.18 09/20/2022   TSH 2.71 06/02/2020   TSH 4.62 (H) 11/26/2019   TSH 3.13 10/08/2019   FREET4 0.71 09/20/2022   FREET4 0.76 06/02/2020   FREET4 0.76 11/26/2019    Lab Results  Component Value Date   FREET4 0.71 09/20/2022   FREET4 0.76 06/02/2020   FREET4 0.76 11/26/2019   T3FREE 3.4 09/20/2022   T3FREE 3.6 06/02/2020   T3FREE 3.4 11/26/2019   T3FREE 2.9 10/08/2019   No galactorrhea, but had occasional breast tenderness (not bothering him anymore) and enlargement. No headaches or blurry/double vision. Not on Risperdal, Reglan , not using any drugs including marijuana.  He tolerates the Cabergoline  well, without any side effects. He did not notice any improvement in morning erections after starting Cabergoline .  DM2: - managed by PCP - In 04/2021, he had a wrist steroid injection >> increased urination, blurry vision >> CBG 400 >> started Metformin. 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  Also, he has anemia.  He has an adopted 53 y/o son. He is not interested in having more children.  ROS: + see HPI  I reviewed pt's medications, allergies, PMH, social hx, family hx, and changes  were documented in the history of present illness. Otherwise, unchanged from my initial visit note.  Past Medical History:  Diagnosis Date   GERD (gastroesophageal reflux disease)    Hypertension    pt states currently on no medications.   Left groin hernia    Sleep apnea    Diagnosed 2014-uses CPAP.   Past Surgical History:  Procedure Laterality Date   ANKLE FRACTURE SURGERY     last year with a plate   CHOLECYSTECTOMY  09/22/2015  COLONOSCOPY N/A 05/27/2013   Procedure: COLONOSCOPY;  Surgeon: Ruby Corporal, MD;  [facility];  Service: Endoscopy;  Laterality: N/A;  100   COLONOSCOPY N/A 09/26/2016   Procedure: COLONOSCOPY;  Surgeon: Ruby Corporal, MD;  [facility]: [facility];  Service: Endoscopy;  Laterality: N/A;  1:00   COLONOSCOPY WITH PROPOFOL  N/A 06/20/2022   Procedure: COLONOSCOPY WITH PROPOFOL ;  Surgeon: Urban Garden, MD;  [facility]: [facility];  Service: Gastroenterology;  Laterality: N/A;  11:00am, asa 1-2, pt does not want to move up   ORIF ANKLE FRACTURE  05/10/2012   Procedure: OPEN REDUCTION INTERNAL FIXATION (ORIF) ANKLE FRACTURE;  Surgeon: Glo Larch, MD;  Location: [facility];  Service: Orthopedics;  Laterality: Right;   POLYPECTOMY  09/26/2016   Procedure: POLYPECTOMY;  Surgeon: Ruby Corporal, MD;  [facility];  Service: Endoscopy;;  colon   POLYPECTOMY  06/20/2022   Procedure: POLYPECTOMY INTESTINAL;  Surgeon: Urban Garden, MD;  [facility];  Service: Gastroenterology;;   ROBOT ASSISTED LAPAROSCOPIC NEPHRECTOMY N/A 09/22/2015   Procedure: XI ROBOTIC ASSISTED LAPAROSCOPIC LEFT RADIAL NEPHRECTOMY WITH EXTENSIVE LAPAROSCOPIC ADHESIOLYSIS ;  Surgeon: Osborn Blaze, MD;  Location: [facility];  Service: Urology;  Laterality: N/A;   Social History   Socioeconomic History   Marital status: Married    Spouse name: Not on file   Number of children: 1   Years of education: Not on file   Highest education level:  Not on file  Occupational History   Not on file  Tobacco Use   Smoking status: Never   Smokeless tobacco: Never  Vaping Use   Vaping status: Never Used  Substance and Sexual Activity   Alcohol use: No   Drug use: No   Sexual activity: Not on file  Other Topics Concern   Not on file  Social History Narrative   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   Has an adopted son - 53 y/o in 2021   Social Drivers of Corporate investment banker Strain: Not on [employer] Insecurity: Not on file  Transportation Needs: Not on file  Physical Activity: Not on file  Stress: Not on file  Social Connections: Not on file  Intimate Partner Violence: Not on file   Current Outpatient Medications on File Prior to Visit  Medication Sig Dispense Refill   cabergoline  (DOSTINEX ) 0.5 MG tablet TAKE 1 OF A TABLET BY MOUTH 2 TIMES A WEEK. 30 tablet 3   CONTOUR NEXT TEST test strip 1 each daily.     famotidine -calcium  carbonate-magnesium  hydroxide (PEPCID  COMPLETE) 10-800-165 MG CHEW chewable tablet Chew 1 tablet by mouth at bedtime as needed (Heartburn).     LORazepam  (ATIVAN ) 0.5 MG tablet Take 2-4 tablets (1-2 mg total) by mouth every 4 (four) hours as needed for anxiety. (For MRI test) (Patient not taking: Reported on 03/20/2023) 6 tablet 0   losartan (COZAAR) 25 MG tablet Take 25 mg by mouth daily.     metFORMIN (GLUCOPHAGE) 1000 MG tablet Take 1,000 mg by mouth 2 (two) times daily.     rosuvastatin (CRESTOR) 5 MG tablet Take 5 mg by mouth daily.     tiZANidine (ZANAFLEX) 4 MG tablet Take 4 mg by mouth at bedtime.     No current facility-administered medications on file prior to visit.   Allergies  Allergen Reactions   Other Nausea And Vomiting    ALL  PAIN MEDS   Family History  Problem Relation Age of Onset   Colon cancer Father    Kidney disease Father    Colon cancer Other    Colon cancer Other    Hypertension Mother    PE: BP  120/72   Pulse 62   Ht 6\' 3"  (1.905 m)   Wt (!) 328 lb 12.8 oz (149.1 kg)   SpO2 99%   BMI 41.10 kg/m  Wt Readings from Last 3 Encounters:  09/16/23 (!) 328 lb 12.8 oz (149.1 kg)  03/20/23 (!) 326 lb 12.8 oz (148.2 kg)  09/18/22 (!) 339 lb 9.6 oz (154 kg)   Constitutional: overweight, in NAD Eyes: EOMI, no exophthalmos ENT: no thyromegaly, no cervical lymphadenopathy Cardiovascular: RRR, No MRG, + B pitting edema R (history of ankle fracture)>L Respiratory: CTA B Musculoskeletal: no deformities Skin: no rashes Neurological: no tremor with outstretched hands  ASSESSMENT and PLAN: 1.  Hyperprolactinemia - Patient with hyperprolactinemia, most likely related to his pituitary adenoma - on the latest MRI, surprisingly, the tumor did not significantly change in size compared to the previous MRI.  However, we discussed that sometimes it may take longer time for tumors to string - Complicated by hypogonadotropic hypogonadism. - Previously on Cabergoline  0.25 mg twice a week but we increased the dose to 0.5 mg twice a week and he continues this dose now - No side effects from Cabergoline  including rhinorrhea, congestion, headaches, orthostasis, dizziness, nausea.  In fact, before last visit he lost 15 pounds after increasing the dose of Cabergoline .  He is also on metformin. 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  This was extending in the cavernous sinus, so likely not completely amenable to surgery.   - we repeated an MRI 05/20/2023 (with Ativan  due to anxiety) and this showed a 5 to 10% decrease in tumor size.  However, in the setting of normal prolactin, I would have expected a larger reduction in the tumor size.  At today's visit we discussed about repeating another MRI in a year from the previous.  If the tumor does not continue to decrease, we may need to send him to neurosurgery. -Previous pituitary hormones were normal with the exception of prolactin and also an elevated ACTH  with normal cortisol.  At last visit and at the previous visit I advised him to perform a 24-hour urine cortisol but he did not perform these.  We again discussed at today's visit that this is quite an important test to see if he has Cushing disease and may need surgery sooner. - Will order the 24-hour urine cortisol again  3.  Hypogonadotropic hypogonadism -Testosterone  level was low and LH and FSH levels were also  low, consistent with hypogonadotrophic hypogonadism, most likely induced by the high prolactin -We discussed that more than 70% the patient has normalized their testosterone  levels 1 year after prolactin normalization - At today's visit we will check prolactin and testosterone  - His morning erections improved after starting Cabergoline  - He may need testosterone  replacement if the testosterone  levels do not improve after normalizing prolactin level - He is not interested in fertility  Orders Placed This Encounter  Procedures   Testosterone  Total,Free,Bio, Males   TSH   T4, free   T3, free   Prolactin   Luteinizing hormone   Follicle stimulating hormone    Growth hormone   Cortisol   ACTH    Cortisol,free,24 hour urine w/creatinine   Component     Latest Ref Rng 09/16/2023  Albumin MSPROF     3.6 - 5.1 g/dL 4.7   Triiodothyronine,Free,Serum     2.3 - 4.2 pg/mL 3.3   TSH     0.40 - 4.50 mIU/L 3.38   Testosterone      250 - 827 ng/dL 960 (L)   Sex Horm Binding Glob, Serum     10 - 50 nmol/L 25   Testosterone  Free     46.0 - 224.0 pg/mL See below   Testosterone , Bioavailable     110.0 - 575.0 ng/dL --   Prolactin     2.0 - 18.0 ng/mL 10.5   Cortisol, Plasma     mcg/dL 45.4   U9,WJXB(JYNWGN)     0.8 - 1.8 ng/dL 1.0   FSH     1.4 - 56.2 mIU/mL 11.5   LH     1.5 - 9.3 mIU/mL 4.1   Growth Hormone     < OR = 7.1 ng/mL 0.5   C206 ACTH      6 - 50 pg/mL 61 (H)   ACTH  is still slightly elevated, but a little lower than before.  The cortisol level and the rest of the pituitary hormones are normal.  Will await the 24-hour urine collection. Testosterone  level improved significantly since last check.  The free testosterone  level calculated with the [employer] calculator is actually 36 pg/mL, just slightly low.  Emilie Harden, MD PhD [facility] Endocrinology

## 2023-09-16 NOTE — Patient Instructions (Signed)
Please stop at the lab.  Continue Cabergoline 0.5 mg 2x a week.  Please return in 6 months.

## 2023-09-19 LAB — TESTOSTERONE TOTAL,FREE,BIO, MALES
Albumin: 4.7 g/dL (ref 3.6–5.1)
Sex Hormone Binding: 25 nmol/L (ref 10–50)
Testosterone: 172 ng/dL — ABNORMAL LOW (ref 250–827)

## 2023-09-19 LAB — LUTEINIZING HORMONE: LH: 4.1 m[IU]/mL (ref 1.5–9.3)

## 2023-09-19 LAB — TSH: TSH: 3.38 m[IU]/L (ref 0.40–4.50)

## 2023-09-19 LAB — T3, FREE: T3, Free: 3.3 pg/mL (ref 2.3–4.2)

## 2023-09-19 LAB — T4, FREE: Free T4: 1 ng/dL (ref 0.8–1.8)

## 2023-09-19 LAB — PROLACTIN: Prolactin: 10.5 ng/mL (ref 2.0–18.0)

## 2023-09-19 LAB — ACTH: C206 ACTH: 61 pg/mL — ABNORMAL HIGH (ref 6–50)

## 2023-09-19 LAB — CORTISOL: Cortisol, Plasma: 12.3 ug/dL

## 2023-09-19 LAB — GROWTH HORMONE: Growth Hormone: 0.5 ng/mL (ref <=7.1)

## 2023-09-19 LAB — FOLLICLE STIMULATING HORMONE: FSH: 11.5 m[IU]/mL (ref 1.4–12.8)

## 2023-09-20 ENCOUNTER — Encounter: Payer: Self-pay | Admitting: Internal Medicine

## 2023-10-16 ENCOUNTER — Other Ambulatory Visit: Payer: Self-pay | Admitting: Internal Medicine

## 2023-10-21 ENCOUNTER — Ambulatory Visit (INDEPENDENT_AMBULATORY_CARE_PROVIDER_SITE_OTHER): Admitting: Audiology

## 2023-10-21 ENCOUNTER — Ambulatory Visit (INDEPENDENT_AMBULATORY_CARE_PROVIDER_SITE_OTHER): Admitting: Otolaryngology

## 2023-10-21 ENCOUNTER — Encounter (INDEPENDENT_AMBULATORY_CARE_PROVIDER_SITE_OTHER): Payer: Self-pay | Admitting: Otolaryngology

## 2023-10-21 VITALS — BP 126/85 | HR 66 | Ht 74.0 in | Wt 325.0 lb

## 2023-10-21 DIAGNOSIS — H9011 Conductive hearing loss, unilateral, right ear, with unrestricted hearing on the contralateral side: Secondary | ICD-10-CM

## 2023-10-21 DIAGNOSIS — H60331 Swimmer's ear, right ear: Secondary | ICD-10-CM

## 2023-10-21 DIAGNOSIS — H6091 Unspecified otitis externa, right ear: Secondary | ICD-10-CM

## 2023-10-21 MED ORDER — CIPROFLOXACIN-DEXAMETHASONE 0.3-0.1 % OT SUSP: 4.0000 [drp] | Freq: Two times a day (BID) | OTIC | 8 refills | Status: AC

## 2023-10-22 DIAGNOSIS — H60331 Swimmer's ear, right ear: Secondary | ICD-10-CM | POA: Insufficient documentation

## 2023-10-22 DIAGNOSIS — H9011 Conductive hearing loss, unilateral, right ear, with unrestricted hearing on the contralateral side: Secondary | ICD-10-CM | POA: Insufficient documentation

## 2023-10-22 NOTE — Progress Notes (Signed)
 CC: Right ear hearing loss, right ear pressure  HPI:  Edwin Rogers is a 53 y.o. male who presents today complaining of muffled right ear hearing for the past 3 months.  He has also noted pressure sensation and occasional pain in the right ear.  He denies previous history of otitis media or otitis externa.  He has no previous ENT surgery.  He also denies any recent water  exposure.  He is not on any otologic medication at this time.  Past Medical History:  Diagnosis Date   GERD (gastroesophageal reflux disease)    Hypertension    pt states currently on no medications.   Left groin hernia    Sleep apnea    Diagnosed 2014-uses CPAP.    Past Surgical History:  Procedure Laterality Date   ANKLE FRACTURE SURGERY     last year with a plate   CHOLECYSTECTOMY  09/22/2015   COLONOSCOPY N/A 05/27/2013   Procedure: COLONOSCOPY;  Surgeon: Ruby Corporal, MD;  [facility];  Service: Endoscopy;  Laterality: N/A;  100   COLONOSCOPY N/A 09/26/2016   Procedure: COLONOSCOPY;  Surgeon: Ruby Corporal, MD;  [facility];  Service: Endoscopy;  Laterality: N/A;  1:00   COLONOSCOPY WITH PROPOFOL  N/A 06/20/2022   Procedure: COLONOSCOPY WITH PROPOFOL ;  Surgeon: Urban Garden, MD;  [facility];  Service: Gastroenterology;  Laterality: N/A;  11:00am, asa 1-2, pt does not want to move up   ORIF ANKLE FRACTURE  05/10/2012   Procedure: OPEN REDUCTION INTERNAL FIXATION (ORIF) ANKLE FRACTURE;  Surgeon: Glo Larch, MD;  Location: [facility];  Service: Orthopedics;  Laterality: Right;   POLYPECTOMY  09/26/2016   Procedure: POLYPECTOMY;  Surgeon: Ruby Corporal, MD;  [facility];  Service: Endoscopy;;  colon   POLYPECTOMY  06/20/2022   Procedure: POLYPECTOMY INTESTINAL;  Surgeon: Urban Garden, MD;  [facility];  Service: Gastroenterology;;   ROBOT ASSISTED LAPAROSCOPIC NEPHRECTOMY N/A 09/22/2015   Procedure: XI ROBOTIC ASSISTED  LAPAROSCOPIC LEFT RADIAL NEPHRECTOMY WITH EXTENSIVE LAPAROSCOPIC ADHESIOLYSIS ;  Surgeon: Osborn Blaze, MD;  Location: [facility];  Service: Urology;  Laterality: N/A;    Family History  Problem Relation Age of Onset   Colon cancer Father    Kidney disease Father    Colon cancer Other    Colon cancer Other    Hypertension Mother     Social History:  reports that he has never smoked. He has never used smokeless tobacco. He reports that he does not drink alcohol and does not use drugs.  Allergies:  Allergies  Allergen Reactions   Other Nausea And Vomiting    ALL PAIN MEDS    Prior to Admission medications   Medication Sig Start Date End Date Taking? Authorizing Provider  cabergoline  (DOSTINEX ) 0.5 MG tablet take 1 tablet by mouth twice a week 10/17/23  Yes Emilie Harden, MD  ciprofloxacin-dexamethasone  (CIPRODEX) OTIC suspension Place 4 drops into the right ear 2 (two) times daily for 10 days. 10/21/23 10/31/23 Yes Reynold Caves, MD  CONTOUR NEXT TEST test strip 1 each daily. 01/11/22  Yes [provider]  famotidine -calcium  carbonate-magnesium  hydroxide (PEPCID  COMPLETE) 10-800-165 MG CHEW chewable tablet Chew 1 tablet by mouth at bedtime as needed (Heartburn).   Yes [provider]  LORazepam  (ATIVAN ) 0.5 MG tablet Take 2-4 tablets (1-2 mg total) by mouth every 4 (four) hours as needed for anxiety. (For MRI test) 03/14/23  Yes Emilie Harden, MD  losartan (  COZAAR) 25 MG tablet Take 25 mg by mouth daily.   Yes [provider]  metFORMIN (GLUCOPHAGE) 1000 MG tablet Take 1,000 mg by mouth 2 (two) times daily. 03/01/22  Yes [provider]  rosuvastatin (CRESTOR) 5 MG tablet Take 5 mg by mouth daily.   Yes [provider]  tiZANidine (ZANAFLEX) 4 MG tablet Take 4 mg by mouth at bedtime. 02/22/20  Yes [provider]    Blood pressure 126/85, pulse 66, height 6\' 2"  (1.88 m), weight (!) 325 lb (147.4 kg), SpO2 98%. Exam: General:  Communicates without difficulty, well nourished, no acute distress. Head: Normocephalic, no evidence injury, no tenderness, facial buttresses intact without stepoff. Face/sinus: No tenderness to palpation and percussion. Facial movement is normal and symmetric. Eyes: PERRL, EOMI. No scleral icterus, conjunctivae clear. Neuro: CN II exam reveals vision grossly intact.  No nystagmus at any point of gaze. Ears: Auricles well formed without lesions.  The left ear canal and tympanic membrane are normal.  The right ear canal is edematous, with purulent drainage noted within the medial aspect of the ear canal.  Under the operating microscope, the right ear canal is debrided with a suction catheter.  Nose: External evaluation reveals normal support and skin without lesions.  Dorsum is intact.  Anterior rhinoscopy reveals congested mucosa over anterior aspect of inferior turbinates and intact septum.  No purulence noted. Oral:  Oral cavity and oropharynx are intact, symmetric, without erythema or edema.  Mucosa is moist without lesions. Neck: Full range of motion without pain.  There is no significant lymphadenopathy.  No masses palpable.  Thyroid  bed within normal limits to palpation.  Parotid glands and submandibular glands equal bilaterally without mass.  Trachea is midline. Neuro:  CN 2-12 grossly intact.   Assessment: 1.  Acute right otitis externa, with edematous and erythematous right ear canal and purulent drainage. 2.  The left ear canal and tympanic membrane are normal. 3.  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  Plan: 1.  Otomicroscopy with debridement of the right ear canal. 2.  Ciprodex eardrops 4 drops right ear twice daily for 10 days. 3.  Dry ear precautions on the right side. 4.  The patient will return for reevaluation in 2 weeks.  [name] 10/22/2023, 9:42 AM  .

## 2023-11-04 ENCOUNTER — Encounter (INDEPENDENT_AMBULATORY_CARE_PROVIDER_SITE_OTHER): Payer: Self-pay | Admitting: Otolaryngology

## 2023-11-04 ENCOUNTER — Ambulatory Visit (INDEPENDENT_AMBULATORY_CARE_PROVIDER_SITE_OTHER): Admitting: Otolaryngology

## 2023-11-04 VITALS — HR 80 | Ht 74.0 in | Wt 325.0 lb

## 2023-11-04 DIAGNOSIS — Z09 Encounter for follow-up examination after completed treatment for conditions other than malignant neoplasm: Secondary | ICD-10-CM

## 2023-11-04 DIAGNOSIS — Z8669 Personal history of other diseases of the nervous system and sense organs: Secondary | ICD-10-CM | POA: Diagnosis not present

## 2023-11-04 DIAGNOSIS — H60331 Swimmer's ear, right ear: Secondary | ICD-10-CM

## 2023-11-05 NOTE — Progress Notes (Signed)
 Patient ID: Edwin Rogers, male   DOB: 25-Aug-1970, 53 y.o.   MRN: [id number]  Follow-up: Acute right otitis externa  HPI: The patient is a 53 year old male who returns today for his follow-up evaluation.  He was last seen 2 weeks ago.  At that time, he was noted to have an acute right otitis externa.  He was treated with debridement and Ciprodex  eardrops.  The patient returns today reporting resolution of his right ear discomfort.  His right ear hearing has also improved.  Currently he denies any otalgia, otorrhea, or vertigo.  Exam: General: Communicates without difficulty, well nourished, no acute distress. Head: Normocephalic, no evidence injury, no tenderness, facial buttresses intact without stepoff. Face/sinus: No tenderness to palpation and percussion. Facial movement is normal and symmetric. Eyes: PERRL, EOMI. No scleral icterus, conjunctivae clear. Neuro: CN II exam reveals vision grossly intact.  No nystagmus at any point of gaze. Ears: Auricles well formed without lesions.  Ear canals are intact without mass or lesion.  No erythema or edema is appreciated.  The TMs are intact without fluid. Nose: External evaluation reveals normal support and skin without lesions.  Dorsum is intact.  Anterior rhinoscopy reveals normal mucosa over anterior aspect of inferior turbinates and intact septum.  No purulence noted. Oral:  Oral cavity and oropharynx are intact, symmetric, without erythema or edema.  Mucosa is moist without lesions. Neck: Full range of motion without pain.  There is no significant lymphadenopathy.  No masses palpable.  Thyroid  bed within normal limits to palpation.  Parotid glands and submandibular glands equal bilaterally without mass.  Trachea is midline. Neuro:  CN 2-12 grossly intact.   Assessment: 1.  The patient's acute right otitis externa has resolved. 2.  His ear canals, tympanic membranes, and middle ear spaces are normal.  No middle ear effusion or infection is  noted.  Plan: 1.  The physical exam findings are reviewed with the patient. 2.  Continue dry ear precautions on the right side. 3.  Ciprodex  eardrops as needed for future otitis externa. 4.  The patient is encouraged to call with any questions or concerns.

## 2024-01-13 DIAGNOSIS — Z6841 Body Mass Index (BMI) 40.0 and over, adult: Secondary | ICD-10-CM | POA: Diagnosis not present

## 2024-01-13 DIAGNOSIS — I10 Essential (primary) hypertension: Secondary | ICD-10-CM | POA: Diagnosis not present

## 2024-01-13 DIAGNOSIS — E1165 Type 2 diabetes mellitus with hyperglycemia: Secondary | ICD-10-CM | POA: Diagnosis not present

## 2024-01-13 DIAGNOSIS — B353 Tinea pedis: Secondary | ICD-10-CM | POA: Diagnosis not present

## 2024-01-20 ENCOUNTER — Other Ambulatory Visit: Payer: Self-pay | Admitting: Internal Medicine

## 2024-02-19 ENCOUNTER — Encounter (INDEPENDENT_AMBULATORY_CARE_PROVIDER_SITE_OTHER): Payer: Self-pay | Admitting: Otolaryngology

## 2024-02-19 ENCOUNTER — Ambulatory Visit (INDEPENDENT_AMBULATORY_CARE_PROVIDER_SITE_OTHER): Admitting: Otolaryngology

## 2024-02-19 VITALS — BP 136/85 | HR 62 | Temp 98.2°F

## 2024-02-19 DIAGNOSIS — H6091 Unspecified otitis externa, right ear: Secondary | ICD-10-CM | POA: Diagnosis not present

## 2024-02-19 DIAGNOSIS — H9011 Conductive hearing loss, unilateral, right ear, with unrestricted hearing on the contralateral side: Secondary | ICD-10-CM

## 2024-02-19 DIAGNOSIS — H60331 Swimmer's ear, right ear: Secondary | ICD-10-CM

## 2024-02-20 NOTE — Progress Notes (Signed)
 Patient ID: Edwin Rogers, male   DOB: February 22, 1971, 53 y.o.   MRN: [id number]  CC: Recurrent right otitis externa  HPI: The patient is a 53 year old male who returns today for his follow-up evaluation.  He was last seen in June 2025.  At that time, he was noted to have an acute right otitis externa.  He was successfully treated with Ciprodex  eardrops.  According to the patient, he was doing well until 1 week ago, when he started experiencing clogging sensation in the right ear, with occasional right ear pain.  He denies any left ear symptoms.  His right ear hearing is muffled.  Exam: General: Communicates without difficulty, well nourished, no acute distress. Head: Normocephalic, no evidence injury, no tenderness, facial buttresses intact without stepoff. Face/sinus: No tenderness to palpation and percussion. Facial movement is normal and symmetric. Eyes: PERRL, EOMI. No scleral icterus, conjunctivae clear. Neuro: CN II exam reveals vision grossly intact.  No nystagmus at any point of gaze. Ears: Auricles well formed without lesions.  The left ear canal and tympanic membrane are normal.  Fungal debris and drainage are noted within the right ear canal.  Under the operating microscope, the right ear canal is debrided with a suction catheter.  The right tympanic membrane is edematous.  Nose: External evaluation reveals normal support and skin without lesions.  Dorsum is intact.  Anterior rhinoscopy reveals congested mucosa over anterior aspect of inferior turbinates and intact septum.  No purulence noted. Oral:  Oral cavity and oropharynx are intact, symmetric, without erythema or edema.  Mucosa is moist without lesions. Neck: Full range of motion without pain.  There is no significant lymphadenopathy.  No masses palpable.  Thyroid  bed within normal limits to palpation.  Parotid glands and submandibular glands equal bilaterally without mass.  Trachea is midline. Neuro:  CN 2-12 grossly intact.    Assessment: 1.  Acute right fungal otitis externa. 2.  The left ear canal and tympanic membrane are normal.  Plan: 1.  The physical exam findings are reviewed with the patient. 2.  Otomicroscopy with debridement of the right ear canal. 3.  CSF powder applied to the right ear canal. 4.  Dry ear precautions on the right side. 5.  The patient will return for reevaluation in 1 week.

## 2024-02-27 ENCOUNTER — Ambulatory Visit (INDEPENDENT_AMBULATORY_CARE_PROVIDER_SITE_OTHER): Admitting: Otolaryngology

## 2024-02-27 ENCOUNTER — Encounter (INDEPENDENT_AMBULATORY_CARE_PROVIDER_SITE_OTHER): Payer: Self-pay | Admitting: Otolaryngology

## 2024-02-27 VITALS — BP 148/88 | HR 64 | Temp 97.9°F

## 2024-02-27 DIAGNOSIS — Z09 Encounter for follow-up examination after completed treatment for conditions other than malignant neoplasm: Secondary | ICD-10-CM | POA: Diagnosis not present

## 2024-02-27 DIAGNOSIS — H60331 Swimmer's ear, right ear: Secondary | ICD-10-CM

## 2024-02-27 DIAGNOSIS — Z8669 Personal history of other diseases of the nervous system and sense organs: Secondary | ICD-10-CM | POA: Diagnosis not present

## 2024-02-27 NOTE — Progress Notes (Signed)
 Patient ID: Edwin Rogers, male   DOB: [birth year], 53 y.o.   MRN: [id number]  Follow-up: Right fungal otitis externa  HPI: The patient is a 53 year old male who returns today for his follow-up evaluation.  The patient was last seen 1 week ago.  At that time he was noted to have an acute right fungal otitis externa.  He was treated with debridement and CSF powder.  The patient returns today reporting resolution of his right ear symptoms.  The right ear pain and clogging sensation have resolved.  Currently he denies any otalgia, otorrhea, or hearing difficulty.  Exam: General: Communicates without difficulty, well nourished, no acute distress. Head: Normocephalic, no evidence injury, no tenderness, facial buttresses intact without stepoff. Face/sinus: No tenderness to palpation and percussion. Facial movement is normal and symmetric. Eyes: PERRL, EOMI. No scleral icterus, conjunctivae clear. Neuro: CN II exam reveals vision grossly intact.  No nystagmus at any point of gaze. Ears: Auricles well formed without lesions.  Ear canals are intact without mass or lesion.  No erythema or edema is appreciated.  The TMs are intact without fluid. Nose: External evaluation reveals normal support and skin without lesions.  Dorsum is intact.  Anterior rhinoscopy reveals normal mucosa over anterior aspect of inferior turbinates and intact septum.  No purulence noted. Oral:  Oral cavity and oropharynx are intact, symmetric, without erythema or edema.  Mucosa is moist without lesions. Neck: Full range of motion without pain.  There is no significant lymphadenopathy.  No masses palpable.  Thyroid  bed within normal limits to palpation.  Parotid glands and submandibular glands equal bilaterally without mass.  Trachea is midline. Neuro:  CN 2-12 grossly intact.   Assessment: 1.  The patient's acute right otitis externa has resolved.  No fungal debris is noted today. 2.  Both ear canals, tympanic membranes, and middle ear  spaces are normal.  Plan: 1.  The physical exam findings are reviewed with the patient. 2.  Continue dry ear precautions on the right side. 3.  The patient may use the CSF powder as needed. 4.  The patient is encouraged to call with any questions or concerns.

## 2024-03-03 DIAGNOSIS — Z23 Encounter for immunization: Secondary | ICD-10-CM | POA: Diagnosis not present

## 2024-03-04 ENCOUNTER — Encounter (INDEPENDENT_AMBULATORY_CARE_PROVIDER_SITE_OTHER): Payer: Self-pay | Admitting: Gastroenterology

## 2024-03-17 ENCOUNTER — Other Ambulatory Visit

## 2024-03-17 ENCOUNTER — Ambulatory Visit (INDEPENDENT_AMBULATORY_CARE_PROVIDER_SITE_OTHER): Admitting: Internal Medicine

## 2024-03-17 ENCOUNTER — Encounter: Payer: Self-pay | Admitting: Internal Medicine

## 2024-03-17 VITALS — BP 124/70 | HR 61 | Ht 74.0 in | Wt 324.0 lb

## 2024-03-17 DIAGNOSIS — E230 Hypopituitarism: Secondary | ICD-10-CM

## 2024-03-17 DIAGNOSIS — R7989 Other specified abnormal findings of blood chemistry: Secondary | ICD-10-CM | POA: Diagnosis not present

## 2024-03-17 DIAGNOSIS — D497 Neoplasm of unspecified behavior of endocrine glands and other parts of nervous system: Secondary | ICD-10-CM

## 2024-03-17 NOTE — Progress Notes (Addendum)
 Patient ID: Edwin Rogers, male   DOB: 07-Apr-1971, 53 y.o.   MRN: [id number]   HPI  [name] is a 53 y.o.-year-old male, initially referred by his PCP, Dr. Caswell, returning for follow-up for hyperprolactinemia and pituitary adenoma. Last visit 6 months ago. He saw Dr. Bertell [facility]) retired and the practice closed.  Interim history: He lost 15 lbs before our visit in 2023, after increasing the Cabergoline  dose.  Overall, he lost approximately 60 pounds since 2021.  Weight is also slightly lower since last visit. No vision problems or headaches. He continues to have ophthalmologic follow-up, but he did not have a visual field checked.  He plans to do so next year.  Reviewed and addended history: Patient described a history of fatigue, difficulty losing weight despite intermittent fasting - 16:8 hours, he also has HTN, OSA, and a slightly increased HbA1c of 5.8%.  He was found to have a very low testosterone  in 09/2019.   He was found to have a very high prolactin level in 10/2019 during investigation for hypogonadism.  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 Cleotilde): normal  Pituitary MRI (06/09/2020): 3 x 2.6 x 3.2 cm pituitary mass extending in the cavernous sinus, not much change from before.  05/2021: VF: normal, reportedly  Pituitary MRI (05/20/2023): 5 to 10% tumor size decrease: Continued slight further reduction in overall volume of the pituitary adenoma tissue involving the sellar pituitary tissue and extending into the clivus and sphenoid sinus particularly to the right of midline. For instance, of the tumor tissue within the right sphenoid sinus region has decreased from a maximal dimension of 22 mm to a dimension of 20 mm. The other tumor tissue has reduced in volume by similar small amount. No  evidence tumor progression or new extension. Right cavernous sinus involvement appears similar. No development extension into the suprasellar region. No breakthrough of the posterior planum sphenoidale.  We started Cabergoline  0.25 mg twice a week in 11/2019.   We increased the dose of Cabergoline  to 0.5 mg 2x a week in 09/2022.  No side effects from Cabergoline : no dizziness, congestion, headache, nausea.  Reviewed prolactin levels: Lab Results  Component Value Date   PROLACTIN 10.5 09/16/2023   PROLACTIN 14.2 03/20/2023   PROLACTIN 79.7 (H) 09/20/2022   PROLACTIN 67.0 (H) 03/19/2022   PROLACTIN 30.0 (H) 06/02/2020   PROLACTIN 758.2 (H) 11/26/2019   PROLACTIN 683.1 (H) 11/17/2019   We investigated the rest of his pituitary hormones:  Component     Latest Ref Rng 09/16/2023  Albumin MSPROF     3.6 - 5.1 g/dL 4.7   Triiodothyronine,Free,Serum     2.3 - 4.2 pg/mL 3.3   TSH     0.40 - 4.50 mIU/L 3.38   Testosterone      250 - 827 ng/dL 827 (L)   Sex Horm Binding Glob, Serum     10 - 50 nmol/L 25   Testosterone  Free     46.0 - 224.0 pg/mL See below   Testosterone , Bioavailable     110.0 - 575.0 ng/dL --   Prolactin     2.0 - 18.0 ng/mL 10.5   Cortisol, Plasma     mcg/dL 87.6   U5,Qmzz(Ipmzru)     0.8 - 1.8 ng/dL 1.0   FSH     1.4 - 87.1 mIU/mL 11.5   LH  1.5 - 9.3 mIU/mL 4.1   Growth Hormone     < OR = 7.1 ng/mL 0.5   C206 ACTH      6 - 50 pg/mL 61 (H)   ACTH  is still slightly elevated, but a little lower than before.  The cortisol level and the rest of the pituitary hormones are normal.  Still did not perform a 24-hour urine collection. Testosterone  level improved significantly since last check.  The free testosterone  level calculated with the [employer] calculator is actually 36 pg/mL, just slightly low.  Component     Latest Ref Rng 03/20/2023  Prolactin     2.0 - 18.0 ng/mL 14.2   Cortisol, Plasma     ug/dL 7.1   R793 ACTH      6 - 50 pg/mL 63 (H)      Component     Latest Ref Rng 09/20/2022  Triiodothyronine,Free,Serum     2.3 - 4.2 pg/mL 3.4   TSH     0.35 - 5.50 uIU/mL 2.18   Prolactin     2.0 - 18.0 ng/mL 79.7 (H)   Cortisol, Plasma     ug/dL 89.5   U5,Qmzz(Ipmzru)     0.60 - 1.60 ng/dL 9.28   FSH     1.4 - 81.8 mIU/ML 7.1   LH     1.50 - 9.30 mIU/mL 3.56   Testosterone , Serum (Total)     ng/dL 893 (L)   % Free Testosterone      % 2.1   Free Testosterone , S     pg/mL 22 (L)   Sex Hormone Binding Globulin     nmol/L 22.2   IGF-I, LC/MS     50 - 317 ng/mL 101   Z-Score (Male)     -2.0 - 2.0 SD -0.7   Growth Hormone     < OR = 7.1 ng/mL 0.5   C206 ACTH      6 - 50 pg/mL 89 (H)    Prolactin level = still elevated.  Advised him to increase the dose Cabergoline  to 0.5 mg twice a week. The ACTH  = still elevated.  However, cortisol level is normal.  I asked the patient at the last 2 visits to do a 24-hour urine collection for cortisol, but he did not do this yet... Growth hormone and IGF-I = normal. Testosterone  = much improved, still low, but with a significant increase from last check.  We plan to recheck this after prolactin decreased more. Thyroid  tests =  normal.  Previously:  Of note, ferritin and iron saturation were normal 10/2019.  TFTs were normal: Lab Results  Component Value Date   TSH 3.38 09/16/2023   TSH 2.18 09/20/2022   TSH 2.71 06/02/2020   TSH 4.62 (H) 11/26/2019   TSH 3.13 10/08/2019   FREET4 1.0 09/16/2023   FREET4 0.71 09/20/2022   FREET4 0.76 06/02/2020   FREET4 0.76 11/26/2019    Lab Results  Component Value Date   FREET4 1.0 09/16/2023   FREET4 0.71 09/20/2022   FREET4 0.76 06/02/2020   FREET4 0.76 11/26/2019   T3FREE 3.3 09/16/2023   T3FREE 3.4 09/20/2022   T3FREE 3.6 06/02/2020   T3FREE 3.4 11/26/2019   T3FREE 2.9 10/08/2019   No galactorrhea, but had occasional breast tenderness (not bothering him anymore) and enlargement. No headaches or blurry/double vision. Not on  Risperdal, Reglan , not using any drugs including marijuana.  He tolerates the Cabergoline  well, without any side effects. He did not notice any improvement in morning erections after  starting Cabergoline .  DM2: - managed by PCP - In 04/2021, he had a wrist steroid injection >> increased urination, blurry vision >> CBG 400 >> started Metformin. 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  Also, he has anemia. He has OSA-on a CPAP. He has an adopted 55 y/o son. He is not interested in having more children.  ROS: + see HPI  I reviewed pt's medications, allergies, PMH, social hx, family hx, and changes were documented in the history of present illness. Otherwise, unchanged from my initial visit note.  Past Medical History:  Diagnosis Date   GERD (gastroesophageal reflux disease)    Hypertension    pt states currently on no medications.   Left groin hernia    Sleep apnea    Diagnosed 2014-uses CPAP.   Past Surgical History:  Procedure Laterality Date   ANKLE FRACTURE SURGERY     last year with a plate   CHOLECYSTECTOMY  09/22/2015   COLONOSCOPY N/A 05/27/2013   Procedure: COLONOSCOPY;  Surgeon: Claudis RAYMOND Rivet, MD;  Location: [facility];  Service: Endoscopy;  Laterality: N/A;  100   COLONOSCOPY N/A 09/26/2016   Procedure: COLONOSCOPY;  Surgeon: Rivet Claudis RAYMOND, MD;  Location: [facility];  Service: Endoscopy;  Laterality: N/A;  1:00   COLONOSCOPY WITH PROPOFOL  N/A 06/20/2022   Procedure: COLONOSCOPY WITH PROPOFOL ;  Surgeon: Eartha Angelia Sieving, MD;  Location: [facility];  Service: Gastroenterology;  Laterality: N/A;  11:00am, asa 1-2, pt does not want to move up   ORIF  ANKLE FRACTURE  05/10/2012   Procedure: OPEN REDUCTION INTERNAL FIXATION (ORIF) ANKLE FRACTURE;  Surgeon: Franky CHRISTELLA Pointer, MD;  Location: [facility];  Service: Orthopedics;  Laterality: Right;   POLYPECTOMY  09/26/2016   Procedure: POLYPECTOMY;  Surgeon: Rivet Claudis RAYMOND, MD;  Location: [facility];  Service: Endoscopy;;  colon   POLYPECTOMY  06/20/2022   Procedure: POLYPECTOMY INTESTINAL;  Surgeon: Eartha Angelia Sieving, MD;  Location: [facility];  Service: Gastroenterology;;   ROBOT ASSISTED LAPAROSCOPIC NEPHRECTOMY N/A 09/22/2015   Procedure: XI ROBOTIC ASSISTED LAPAROSCOPIC LEFT RADIAL NEPHRECTOMY WITH EXTENSIVE LAPAROSCOPIC ADHESIOLYSIS ;  Surgeon: Ricardo Likens, MD;  Location: [facility];  Service: Urology;  Laterality: N/A;   Social History   Socioeconomic History   Marital status: Married    Spouse name: Not on file   Number of children: 1   Years of education: Not on file   Highest education level: Not on file  Occupational History   Not on file  Tobacco Use   Smoking status: Never   Smokeless tobacco: Never  Vaping Use   Vaping status: Never Used  Substance and Sexual Activity   Alcohol use: No   Drug use: No   Sexual activity: Not on file  Other Topics Concern   Not on file  Social History Narrative   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   Has an adopted son - 28 y/o in 2021   Social Drivers of Corporate Investment Banker Strain: Not on [employer] Insecurity: Not on  file  Transportation Needs: Not on file  Physical Activity: Not on file  Stress: Not on file  Social Connections: Not on file  Intimate Partner Violence: Not on file   Current Outpatient Medications on File Prior to Visit  Medication Sig Dispense Refill   cabergoline  (DOSTINEX ) 0.5 MG tablet take 1 tablet by mouth twice a week 30 tablet 0   CONTOUR NEXT TEST test strip 1 each daily.     famotidine -calcium  carbonate-magnesium  hydroxide  (PEPCID  COMPLETE) [phone] MG CHEW chewable tablet Chew 1 tablet by mouth at bedtime as needed (Heartburn).     LORazepam  (ATIVAN ) 0.5 MG tablet Take 2-4 tablets (1-2 mg total) by mouth every 4 (four) hours as needed for anxiety. (For MRI test) 6 tablet 0   losartan (COZAAR) 25 MG tablet Take 25 mg by mouth daily.     metFORMIN (GLUCOPHAGE) 1000 MG tablet Take 1,000 mg by mouth 2 (two) times daily.     rosuvastatin (CRESTOR) 5 MG tablet Take 5 mg by mouth daily.     tiZANidine (ZANAFLEX) 4 MG tablet Take 4 mg by mouth at bedtime.     No current facility-administered medications on file prior to visit.   Allergies  Allergen Reactions   Other Nausea And Vomiting    ALL PAIN MEDS   Family History  Problem Relation Age of Onset   Colon cancer Father    Kidney disease Father    Colon cancer Other    Colon cancer Other    Hypertension Mother    PE: BP 124/70   Pulse 61   Ht 6' 2 (1.88 m)   Wt (!) 324 lb (147 kg)   SpO2 99%   BMI 41.60 kg/m  Wt Readings from Last 20 Encounters:  03/17/24 (!) 324 lb (147 kg)  11/04/23 (!) 325 lb (147.4 kg)  10/21/23 (!) 325 lb (147.4 kg)  09/16/23 (!) 328 lb 12.8 oz (149.1 kg)  03/20/23 (!) 326 lb 12.8 oz (148.2 kg)  09/18/22 (!) 339 lb 9.6 oz (154 kg)  06/20/22 (!) 340 lb (154.2 kg)  03/19/22 (!) 346 lb 3.2 oz (157 kg)  06/02/20 (!) 347 lb 3.2 oz (157.5 kg)  03/07/20 (!) 345 lb 9.6 oz (156.8 kg)  01/05/20 (!) 356 lb (161.5 kg)  11/26/19 (!) 361 lb (163.7 kg)  11/17/19 (!) 360 lb (163.3 kg)  10/08/19 (!) 363 lb (164.7 kg)  05/29/17 (!) 328 lb 1.6 oz (148.8 kg)  09/26/16 (!) 340 lb (154.2 kg)  07/26/16 (!) 334 lb 14.4 oz (151.9 kg)  09/22/15 (!) 344 lb (156 kg)  09/19/15 (!) 344 lb (156 kg)  06/29/15 (!) 339 lb 1.6 oz (153.8 kg)   Constitutional: overweight, in NAD Eyes: EOMI, no exophthalmos ENT: no thyromegaly, no cervical lymphadenopathy Cardiovascular: RRR, No MRG, + B pitting edema R (history of ankle fracture)>L Respiratory:  CTA B Musculoskeletal: no deformities Skin: no rashes Neurological: + tremor with outstretched R hand  ASSESSMENT and PLAN: 1.  Hyperprolactinemia - Patient with hyperprolactinemia, most likely related to his pituitary adenoma - On the MRI from 05/20/2023, the tumor appears to be smaller.  We discussed that sometimes it may take longer time for the tumors to shrink.  sometimes it may take longer time for tumors to shrink - This is complicated by hypogonadotropic hypogonadism - He is currently on 0.5 mg twice a week of Cabergoline  - No side effects from Cabergoline  including rhinorrhea, congestion, headaches, orthostasis, dizziness, nausea.  He lost 15 pounds after  increasing the dose of Cabergoline .  He is also on metformin. - Testosterone  improved after starting Cabergoline , with improved erections. 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  This was extending into the cavernous sinus, therefore likely not completely amenable to surgery - We repeated an MRI on 05/20/2023 (with Ativan  due to anxiety) and this showed a 5 to 10% decrease in tumor size.  However, in the setting of a normal prolactin, I would have expected  a larger reduction in the tumor size.  It is possible that the tumor shrinks at a slower pace, but we did discuss about repeating another MRI in a year from the previous.  If the tumor does not continue to decrease, we may need neurosurgery involvement. - At today's visit, we discussed about repeating another MRI next year -he will get in touch with me to order this - Previous pituitary hormones were normal with the exception of prolactin and also an elevated ACTH  with normal cortisol.  At the last 2 visits I advised him to perform a 24-hour urine cortisol but he did not perform these.  We discussed again that it would be important to check for Cushing disease, however, due to clinical stability, my suspicion for this has decreased - Last visit I ordered the 24-hour urine cortisol level again but he did not have this checked.  He does have the jugs at home and will bring them.  3.  Hypogonadotropic hypogonadism -Testosterone  level was low and LH and FSH levels were also low, consistent with hypogonadotrophic hypogonadism, most likely induced by the high prolactin.  At last check, his prolactin level was normal and the free testosterone  calculated by the [facility] calculator was only slightly low. - We discussed that more than 70% the patient has normalized their testosterone  levels 1 year after prolactin normalization - At this visit, we will check prolactin and testosterone  again - He has morning erections improved after starting Cabergoline  - He may need testosterone  replacement if the testosterone  levels do not improve after normalizing prolactin level - He is not interested in fertility  Orders Placed This Encounter  Procedures   TSH   T4, free   T3, free   Prolactin   Luteinizing hormone   Insulin -like growth factor   Follicle stimulating hormone   Cortisol   ACTH    Testosterone  Total,Free,Bio, Males  Needs refills of Cabergoline .  Component     Latest Ref Rng 03/17/2024  Albumin  MSPROF     3.6 - 5.1 g/dL 4.2   Triiodothyronine,Free,Serum     2.3 - 4.2 pg/mL 2.9   TSH     0.40 - 4.50 mIU/L 3.59   Testosterone      250 - 827 ng/dL 874 (L)   Sex Horm Binding  Glob, Serum     10 - 50 nmol/L 20   Testosterone  Free     46.0 - 224.0 pg/mL See below   Testosterone , Bioavailable     110.0 - 575.0 ng/dL -   Prolactin     2.0 - 18.0 ng/mL 11.4   Cortisol, Plasma     mcg/dL 9.5   U5,Qmzz(Ipmzru)     0.8 - 1.8 ng/dL 0.9   FSH     1.4 - 87.1 mIU/mL 11.8   LH     1.5 - 9.3 mIU/mL 4.4   IGF-I, LC/MS     50 - 317 ng/mL 305   Z-Score (Male)     -2.0 - 2.0 SD 1.8   C206 ACTH      6 - 50 pg/mL 58 (H)   ACTH  is a little lower, only slightly above target, while cortisol level is normal.  I will still advise him to try to bring the 24-hour urine to check for excessive cortisol production.   Prolactin level is normal so I will refill his Cabergoline . Testosterone  is very low.  At this point, qualify for testosterone  replacement.  Will discuss with patient.  Lela Fendt, MD PhD [facility]

## 2024-03-17 NOTE — Patient Instructions (Addendum)
 Please stop at the lab.  Continue Cabergoline  0.5 mg 2x a week.  Please return in 1 year, but send me a message before the visit, to order the new MRI.

## 2024-03-18 LAB — TESTOSTERONE TOTAL,FREE,BIO, MALES
Albumin: 4.2 g/dL (ref 3.6–5.1)
Sex Hormone Binding: 20 nmol/L (ref 10–50)
Testosterone: 125 ng/dL — ABNORMAL LOW (ref 250–827)

## 2024-03-20 LAB — ACTH: C206 ACTH: 58 pg/mL — ABNORMAL HIGH (ref 6–50)

## 2024-03-24 ENCOUNTER — Ambulatory Visit: Payer: Self-pay | Admitting: Internal Medicine

## 2024-03-24 LAB — CORTISOL: Cortisol, Plasma: 9.5 ug/dL

## 2024-03-24 LAB — LUTEINIZING HORMONE: LH: 4.4 m[IU]/mL (ref 1.5–9.3)

## 2024-03-24 LAB — PROLACTIN: Prolactin: 11.4 ng/mL (ref 2.0–18.0)

## 2024-03-24 LAB — FOLLICLE STIMULATING HORMONE: FSH: 11.8 m[IU]/mL (ref 1.4–12.8)

## 2024-03-24 LAB — INSULIN-LIKE GROWTH FACTOR
IGF-I, LC/MS: 305 ng/mL (ref 50–317)
Z-Score (Male): 1.8 {STDV} (ref ?–2.0)

## 2024-03-24 LAB — T3, FREE: T3, Free: 2.9 pg/mL (ref 2.3–4.2)

## 2024-03-24 LAB — TSH: TSH: 3.59 m[IU]/L (ref 0.40–4.50)

## 2024-03-24 LAB — T4, FREE: Free T4: 0.9 ng/dL (ref 0.8–1.8)

## 2024-03-24 MED ORDER — CABERGOLINE 0.5 MG PO TABS: ORAL_TABLET | ORAL | 3 refills | Status: AC

## 2024-03-24 NOTE — Addendum Note (Signed)
 Addended by: TRIXIE FILE on: 03/24/2024 03:48 PM   Modules accepted: Orders

## 2024-06-26 ENCOUNTER — Encounter: Payer: Self-pay | Admitting: Physician Assistant

## 2024-06-26 ENCOUNTER — Ambulatory Visit: Admitting: Physician Assistant

## 2024-06-26 VITALS — BP 118/90 | HR 69 | Temp 98.2°F | Ht 74.0 in | Wt 334.0 lb

## 2024-06-26 DIAGNOSIS — I10 Essential (primary) hypertension: Secondary | ICD-10-CM

## 2024-06-26 DIAGNOSIS — E139 Other specified diabetes mellitus without complications: Secondary | ICD-10-CM

## 2024-06-26 DIAGNOSIS — E230 Hypopituitarism: Secondary | ICD-10-CM | POA: Insufficient documentation

## 2024-06-26 DIAGNOSIS — Z905 Acquired absence of kidney: Secondary | ICD-10-CM | POA: Insufficient documentation

## 2024-06-26 DIAGNOSIS — D352 Benign neoplasm of pituitary gland: Secondary | ICD-10-CM | POA: Insufficient documentation

## 2024-06-26 DIAGNOSIS — E782 Mixed hyperlipidemia: Secondary | ICD-10-CM

## 2024-06-26 MED ORDER — ROSUVASTATIN CALCIUM 5 MG PO TABS: 5.0000 mg | ORAL_TABLET | Freq: Every day | ORAL | 1 refills | Status: AC

## 2024-06-26 MED ORDER — METFORMIN HCL 1000 MG PO TABS: 1000.0000 mg | ORAL_TABLET | Freq: Two times a day (BID) | ORAL | 1 refills | Status: AC

## 2024-06-26 MED ORDER — LOSARTAN POTASSIUM 25 MG PO TABS: 25.0000 mg | ORAL_TABLET | Freq: Every day | ORAL | 1 refills | Status: AC

## 2024-06-26 MED ORDER — TIZANIDINE HCL 4 MG PO TABS: 4.0000 mg | ORAL_TABLET | Freq: Every day | ORAL | 1 refills | Status: AC

## 2024-06-26 NOTE — Assessment & Plan Note (Signed)
 Blood pressure well controlled. Reports being on losartan  for kidney protection, and not hypertension management.  - Continue losartan  daily.

## 2024-06-26 NOTE — Assessment & Plan Note (Signed)
 Managed with medication. Prolactin levels have decreased significantly from 800 with treatment. Regular follow-ups with endocrinology every six months. Surgery is being considered if necessary. - Continue current medication regimen for pituitary adenoma - Continue regular follow-ups with endocrinology every six months

## 2024-06-26 NOTE — Assessment & Plan Note (Signed)
 Diabetes is well-controlled with metformin . Last HbA1c was 5.7%. Discussed potential use of GLP-1 agonists for weight loss and sleep apnea, but insurance coverage is limited to diabetes management. Current weight is 330 lbs, up from 325 lbs, with a history of higher weight. He is aware of the need to male lifestyle changes, such as reduce soda intake to aid weight management. - Continue metformin  1000 mg twice daily - Patient is on an ACEI and a statin - A1c, lipid panel, and urine ACR ordered  - Increase dietary efforts and physical activity. - Routine diabetic retinopathy screening: up-to-date.

## 2024-06-26 NOTE — Assessment & Plan Note (Signed)
 Patient reports cholesterol levels are very low. Currently taking cholesterol medication as part of diabetes management. He has uncertainty about the necessity of continuing the cholesterol medication. - Ordered lab work to assess current cholesterol levels - Continue current cholesterol medication regimen

## 2024-06-26 NOTE — Progress Notes (Signed)
 "  New Patient Office Visit  Subjective    Patient ID: Edwin Rogers, male    DOB: 1971-01-11  Age: 54 y.o. MRN: [id number]  CC:  Chief Complaint  Patient presents with   New Patient (Initial Visit)    No C/O - refills pending.    HPI Edwin Rogers presents to establish care  Discussed the use of AI scribe software for clinical note transcription with the patient, who gave verbal consent to proceed.  History of Present Illness Edwin Rogers is a 54 year old male who presents to establish care and requests medication refills.  He has a history of a pituitary adenoma for which he has been following with endocrinology to receive treatment. Surgery has been discussed as a potential option, but currently he is on oral medication twice weekly. He follows up with endocrinology every six months. His prolactin levels were initially very high at around 800 but have decreased significantly with medication.  He has diabetes, managed with metformin  1000 mg twice daily. He reports his last A1c was 5.7, approximately 1 year ago. 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 He questions the necessity of the cholesterol medication as his levels have been very low in the past.  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 He underwent surgery and has been recently cleared after five years of follow-ups. His kidney function remains stable with the remaining right kidney.  He experiences sleep apnea and uses a CPAP machine, which also helps with acid reflux. He takes Pepsid a couple of times a week for reflux symptoms triggered by certain foods. He reports back pain, primarily on the right side, which he attributes to sleeping positions. He takes a muscle relaxer at night for this issue, prescribed for up to three times a day, but he finds one at bedtime sufficient.  He has a  history of gallbladder removal, which was done concurrently with his kidney surgery. He also has a history of a right ankle injury from a fall. No history of smoking. His wife, a former smoker, now smokes in the garage, minimizing his exposure. He is up to date on his colonoscopy, with next due January 2027.    Outpatient Encounter Medications as of 06/26/2024  Medication Sig   cabergoline  (DOSTINEX ) 0.5 MG tablet take 1 tablet by mouth twice a week   CONTOUR NEXT TEST test strip 1 each daily.   esomeprazole (NEXIUM) 40 MG capsule Take 40 mg by mouth daily.   famotidine -calcium  carbonate-magnesium  hydroxide (PEPCID  COMPLETE) 10-800-165 MG CHEW chewable tablet Chew 1 tablet by mouth at bedtime as needed (Heartburn).   fenofibrate (TRICOR) 145 MG tablet Take 145 mg by mouth daily.   [DISCONTINUED] losartan  (COZAAR ) 25 MG tablet Take 25 mg by mouth daily.   [DISCONTINUED] metFORMIN  (GLUCOPHAGE ) 1000 MG tablet Take 1,000 mg by mouth 2 (two) times daily.   [DISCONTINUED] rosuvastatin  (CRESTOR ) 5 MG tablet Take 5 mg by mouth daily.   [DISCONTINUED] tiZANidine  (ZANAFLEX ) 4 MG tablet Take 4 mg by mouth at bedtime.   losartan  (COZAAR ) 25 MG tablet Take 1 tablet (25 mg total) by mouth daily.   metFORMIN  (GLUCOPHAGE ) 1000 MG tablet Take 1 tablet (1,000 mg total) by mouth 2 (two) times daily.   rosuvastatin  (CRESTOR ) 5 MG tablet Take 1 tablet (5 mg total) by mouth daily.   tiZANidine  (ZANAFLEX ) 4 MG tablet  Take 1 tablet (4 mg total) by mouth at bedtime.   [DISCONTINUED] LORazepam  (ATIVAN ) 0.5 MG tablet Take 2-4 tablets (1-2 mg total) by mouth every 4 (four) hours as needed for anxiety. (For MRI test)   No facility-administered encounter medications on file as of 06/26/2024.    Past Medical History:  Diagnosis Date   GERD (gastroesophageal reflux disease)    Hypertension    pt states currently on no medications.   Left groin hernia    Sleep apnea    Diagnosed 2014-uses CPAP.    Past Surgical History:   Procedure Laterality Date   ANKLE FRACTURE SURGERY     last year with a plate   CHOLECYSTECTOMY  09/22/2015   COLONOSCOPY N/A 05/27/2013   Procedure: COLONOSCOPY;  Surgeon: Claudis RAYMOND Rivet, MD;  Location: [facility];  Service: Endoscopy;  Laterality: N/A;  100   COLONOSCOPY N/A 09/26/2016   Procedure: COLONOSCOPY;  Surgeon: Rivet Claudis RAYMOND, MD;  Location: [facility];  Service: Endoscopy;  Laterality: N/A;  1:00   COLONOSCOPY WITH PROPOFOL  N/A 06/20/2022   Procedure: COLONOSCOPY WITH PROPOFOL ;  Surgeon: Eartha Angelia Sieving, MD;  Location: [facility];  Service: Gastroenterology;  Laterality: N/A;  11:00am, asa 1-2, pt does not want to move up   ORIF ANKLE FRACTURE  05/10/2012   Procedure: OPEN REDUCTION INTERNAL FIXATION (ORIF) ANKLE FRACTURE;  Surgeon: Franky CHRISTELLA Pointer, MD;  Location: [facility];  Service: Orthopedics;  Laterality: Right;   POLYPECTOMY  09/26/2016   Procedure: POLYPECTOMY;  Surgeon: Rivet Claudis RAYMOND, MD;  Location: [facility];  Service: Endoscopy;;  colon   POLYPECTOMY  06/20/2022   Procedure: POLYPECTOMY INTESTINAL;  Surgeon: Eartha Angelia Sieving, MD;  Location: [facility];  Service: Gastroenterology;;   ROBOT ASSISTED LAPAROSCOPIC NEPHRECTOMY N/A 09/22/2015   Procedure: XI ROBOTIC ASSISTED LAPAROSCOPIC LEFT RADIAL NEPHRECTOMY WITH EXTENSIVE LAPAROSCOPIC ADHESIOLYSIS ;  Surgeon: Ricardo Likens, MD;  Location: [facility];  Service: Urology;  Laterality: N/A;    Family History  Problem Relation Age of Onset   Colon cancer Father    Kidney disease Father    Colon cancer Other    Colon cancer Other    Hypertension Mother     Social History   Socioeconomic History   Marital status: Married    Spouse name: Not on file   Number of children: 1   Years of education: Not on file   Highest education level: Master's degree (e.g., MA, MS, MEng, MEd, MSW, MBA)  Occupational History   Not on file  Tobacco Use   Smoking status: Never   Smokeless tobacco: Never   Vaping Use   Vaping status: Never Used  Substance and Sexual Activity   Alcohol use: No   Drug use: No   Sexual activity: Not on file  Other Topics Concern   Not on file  Social History Narrative   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   Has an adopted son - 83 y/o in 2021   Social Drivers of Health   Tobacco Use: Low Risk (06/26/2024)   Patient History    Smoking Tobacco Use: Never    Smokeless Tobacco Use: Never    Passive Exposure: Not on file  Financial Resource Strain: Low Risk (06/24/2024)   Overall Financial Resource Strain (CARDIA)    Difficulty of Paying Living Expenses: Not very hard  Food Insecurity: No Food Insecurity (06/24/2024)   Epic  Worried About Programme Researcher, Broadcasting/film/video in the Last Year: Never true    Ran Out of Food in the Last Year: Never true  Transportation Needs: No Transportation Needs (06/24/2024)   Epic    Lack of Transportation (Medical): No    Lack of Transportation (Non-Medical): No  Physical Activity: Inactive (06/24/2024)   Exercise Vital Sign    Days of Exercise per Week: 0 days    Minutes of Exercise per Session: Not on file  Stress: No Stress Concern Present (06/24/2024)   [employer] of Occupational Health - Occupational Stress Questionnaire    Feeling of Stress: Only a little  Social Connections: Socially Integrated (06/24/2024)   Social Connection and Isolation Panel    Frequency of Communication with Friends and Family: More than three times a week    Frequency of Social Gatherings with Friends and Family: Once a week    Attends Religious Services: More than 4 times per year    Active Member of [employer] or Organizations: Yes    Attends Banker Meetings: More than 4 times per year    Marital Status: Married  Catering Manager Violence: Not on file  Depression (PHQ2-9): Low Risk (06/26/2024)   Depression (PHQ2-9)    PHQ-2 Score: 0  Alcohol Screen: Not on file  Housing:  Low Risk (06/24/2024)   Epic    Unable to Pay for Housing in the Last Year: No    Number of Times Moved in the Last Year: 0    Homeless in the Last Year: No  Utilities: Not on file  Health Literacy: Not on file    Review of Systems  Constitutional:  Negative for activity change, appetite change, fatigue and fever.  Eyes:  Negative for visual disturbance.  Respiratory:  Negative for cough and shortness of breath.   Cardiovascular:  Negative for chest pain.  Musculoskeletal:  Positive for back pain.  Neurological:  Negative for light-headedness and headaches.       Objective    BP (!) 118/90   Pulse 69   Temp 98.2 F (36.8 C)   Ht 6' 2 (1.88 m)   Wt (!) 334 lb (151.5 kg)   SpO2 99%   BMI 42.88 kg/m   Physical Exam Constitutional:      General: He is not in acute distress.    Appearance: Normal appearance. He is obese. He is not ill-appearing.  HENT:     Head: Normocephalic and atraumatic.     Mouth/Throat:     Mouth: Mucous membranes are moist.     Pharynx: Oropharynx is clear.  Eyes:     Extraocular Movements: Extraocular movements intact.     Conjunctiva/sclera: Conjunctivae normal.  Cardiovascular:     Rate and Rhythm: Normal rate and regular rhythm.     Heart sounds: Normal heart sounds. No murmur heard. Pulmonary:     Effort: Pulmonary effort is normal.     Breath sounds: Normal breath sounds. No wheezing, rhonchi or rales.  Musculoskeletal:     Right lower leg: No edema.     Left lower leg: No edema.  Skin:    General: Skin is warm and dry.  Neurological:     General: No focal deficit present.     Mental Status: He is alert and oriented to person, place, and time.  Psychiatric:        Mood and Affect: Mood normal.        Behavior: Behavior normal.  Assessment & Plan:  Diabetes 1.5, managed as type 2 (HCC) Assessment & Plan: Diabetes is well-controlled with metformin . Last HbA1c was 5.7%. Discussed potential use of GLP-1 agonists for weight  loss and sleep apnea, but insurance coverage is limited to diabetes management. Current weight is 330 lbs, up from 325 lbs, with a history of higher weight. He is aware of the need to male lifestyle changes, such as reduce soda intake to aid weight management. - Continue metformin  1000 mg twice daily - Patient is on an ACEI and a statin - A1c, lipid panel, and urine ACR ordered  - Increase dietary efforts and physical activity. - Routine diabetic retinopathy screening: up-to-date.   Orders: -     Rosuvastatin  Calcium ; Take 1 tablet (5 mg total) by mouth daily.  Dispense: 90 tablet; Refill: 1 -     metFORMIN  HCl; Take 1 tablet (1,000 mg total) by mouth 2 (two) times daily.  Dispense: 180 tablet; Refill: 1 -     Hemoglobin A1c -     Comprehensive metabolic panel with GFR -     Microalbumin / creatinine urine ratio  Pituitary adenoma (HCC) Assessment & Plan: Managed with medication. Prolactin levels have decreased significantly from 800 with treatment. Regular follow-ups with endocrinology every six months. Surgery is being considered if necessary. - Continue current medication regimen for pituitary adenoma - Continue regular follow-ups with endocrinology every six months    Mixed hyperlipidemia Assessment & Plan: Patient reports cholesterol levels are very low. Currently taking cholesterol medication as part of diabetes management. He has uncertainty about the necessity of continuing the cholesterol medication. - Ordered lab work to assess current cholesterol levels - Continue current cholesterol medication regimen  Orders: -     Rosuvastatin  Calcium ; Take 1 tablet (5 mg total) by mouth daily.  Dispense: 90 tablet; Refill: 1 -     Lipid panel  Essential (primary) hypertension Assessment & Plan: Blood pressure well controlled. Reports being on losartan  for kidney protection, and not hypertension management.  - Continue losartan  daily.   Orders: -     Losartan  Potassium; Take 1  tablet (25 mg total) by mouth daily.  Dispense: 90 tablet; Refill: 1 -     CBC with Differential/Platelet -     Microalbumin / creatinine urine ratio  Other orders -     tiZANidine  HCl; Take 1 tablet (4 mg total) by mouth at bedtime.  Dispense: 90 tablet; Refill: 1    Return in about 6 months (around 12/24/2024) for DM.   Archie Shea, PA-C  "

## 2024-12-24 ENCOUNTER — Ambulatory Visit: Admitting: Physician Assistant

## 2025-03-17 ENCOUNTER — Ambulatory Visit: Admitting: Internal Medicine
